# Patient Record
Sex: Female | Born: 1975 | Race: Asian | Hispanic: No | Smoking: Never smoker
Health system: Southern US, Community
[De-identification: ages and names within clinical notes are randomized; demographics above are authoritative.]

## PROBLEM LIST (undated history)

## (undated) DIAGNOSIS — N939 Abnormal uterine and vaginal bleeding, unspecified: Secondary | ICD-10-CM

## (undated) DIAGNOSIS — I10 Essential (primary) hypertension: Secondary | ICD-10-CM

## (undated) DIAGNOSIS — R51 Headache: Secondary | ICD-10-CM

## (undated) DIAGNOSIS — G4730 Sleep apnea, unspecified: Secondary | ICD-10-CM

## (undated) DIAGNOSIS — R519 Headache, unspecified: Secondary | ICD-10-CM

## (undated) HISTORY — PX: NO PAST SURGERIES: SHX2092

---

## 2016-02-11 DIAGNOSIS — I10 Essential (primary) hypertension: Secondary | ICD-10-CM | POA: Diagnosis not present

## 2016-03-29 DIAGNOSIS — D2230 Melanocytic nevi of unspecified part of face: Secondary | ICD-10-CM | POA: Diagnosis not present

## 2016-03-29 DIAGNOSIS — D2239 Melanocytic nevi of other parts of face: Secondary | ICD-10-CM | POA: Diagnosis not present

## 2016-03-29 DIAGNOSIS — L738 Other specified follicular disorders: Secondary | ICD-10-CM | POA: Diagnosis not present

## 2016-06-22 DIAGNOSIS — M7711 Lateral epicondylitis, right elbow: Secondary | ICD-10-CM | POA: Diagnosis not present

## 2016-08-06 DIAGNOSIS — I10 Essential (primary) hypertension: Secondary | ICD-10-CM | POA: Diagnosis not present

## 2016-08-06 DIAGNOSIS — Z0000 Encounter for general adult medical examination without abnormal findings: Secondary | ICD-10-CM | POA: Diagnosis not present

## 2016-08-09 ENCOUNTER — Other Ambulatory Visit: Payer: Self-pay | Admitting: Family Medicine

## 2016-08-09 DIAGNOSIS — Z1231 Encounter for screening mammogram for malignant neoplasm of breast: Secondary | ICD-10-CM

## 2016-08-17 ENCOUNTER — Ambulatory Visit
Admission: RE | Admit: 2016-08-17 | Discharge: 2016-08-17 | Disposition: A | Discharge status: Home / Self Care | Payer: 59 | Source: Ambulatory Visit | Attending: Family Medicine | Admitting: Family Medicine

## 2016-08-17 DIAGNOSIS — Z1231 Encounter for screening mammogram for malignant neoplasm of breast: Secondary | ICD-10-CM | POA: Diagnosis not present

## 2016-08-18 ENCOUNTER — Other Ambulatory Visit: Payer: Self-pay | Admitting: Family Medicine

## 2016-08-18 ENCOUNTER — Other Ambulatory Visit (HOSPITAL_COMMUNITY)
Admission: RE | Admit: 2016-08-18 | Discharge: 2016-08-18 | Disposition: A | Discharge status: Home / Self Care | Payer: 59 | Source: Ambulatory Visit | Attending: Family Medicine | Admitting: Family Medicine

## 2016-08-18 DIAGNOSIS — R102 Pelvic and perineal pain: Secondary | ICD-10-CM | POA: Diagnosis not present

## 2016-08-18 DIAGNOSIS — Z13220 Encounter for screening for lipoid disorders: Secondary | ICD-10-CM | POA: Diagnosis not present

## 2016-08-18 DIAGNOSIS — Z124 Encounter for screening for malignant neoplasm of cervix: Secondary | ICD-10-CM | POA: Insufficient documentation

## 2016-08-18 DIAGNOSIS — N920 Excessive and frequent menstruation with regular cycle: Secondary | ICD-10-CM | POA: Diagnosis not present

## 2016-08-18 DIAGNOSIS — I10 Essential (primary) hypertension: Secondary | ICD-10-CM | POA: Diagnosis not present

## 2016-08-19 ENCOUNTER — Other Ambulatory Visit: Payer: Self-pay | Admitting: Family Medicine

## 2016-08-19 DIAGNOSIS — R102 Pelvic and perineal pain: Secondary | ICD-10-CM

## 2016-08-19 DIAGNOSIS — M545 Low back pain: Secondary | ICD-10-CM | POA: Diagnosis not present

## 2016-08-19 LAB — CYTOLOGY - PAP
Diagnosis: NEGATIVE
HPV: NOT DETECTED

## 2016-08-24 ENCOUNTER — Other Ambulatory Visit: Payer: 59

## 2016-08-25 DIAGNOSIS — M545 Low back pain: Secondary | ICD-10-CM | POA: Diagnosis not present

## 2016-08-26 ENCOUNTER — Ambulatory Visit
Admission: RE | Admit: 2016-08-26 | Discharge: 2016-08-26 | Disposition: A | Discharge status: Home / Self Care | Payer: 59 | Source: Ambulatory Visit | Attending: Family Medicine | Admitting: Family Medicine

## 2016-08-26 DIAGNOSIS — R102 Pelvic and perineal pain: Secondary | ICD-10-CM

## 2016-08-31 ENCOUNTER — Other Ambulatory Visit (HOSPITAL_COMMUNITY): Payer: Self-pay | Admitting: Family Medicine

## 2016-08-31 DIAGNOSIS — N9489 Other specified conditions associated with female genital organs and menstrual cycle: Secondary | ICD-10-CM

## 2016-09-01 DIAGNOSIS — M545 Low back pain: Secondary | ICD-10-CM | POA: Diagnosis not present

## 2016-09-14 DIAGNOSIS — M545 Low back pain: Secondary | ICD-10-CM | POA: Diagnosis not present

## 2016-09-16 DIAGNOSIS — M545 Low back pain: Secondary | ICD-10-CM | POA: Diagnosis not present

## 2016-09-17 ENCOUNTER — Ambulatory Visit (HOSPITAL_COMMUNITY)
Admission: RE | Admit: 2016-09-17 | Discharge: 2016-09-17 | Disposition: A | Discharge status: Home / Self Care | Payer: 59 | Source: Ambulatory Visit | Attending: Family Medicine | Admitting: Family Medicine

## 2016-09-17 DIAGNOSIS — N9489 Other specified conditions associated with female genital organs and menstrual cycle: Secondary | ICD-10-CM | POA: Diagnosis not present

## 2016-09-17 DIAGNOSIS — N840 Polyp of corpus uteri: Secondary | ICD-10-CM | POA: Diagnosis not present

## 2016-09-27 DIAGNOSIS — M545 Low back pain: Secondary | ICD-10-CM | POA: Diagnosis not present

## 2016-09-30 DIAGNOSIS — M545 Low back pain: Secondary | ICD-10-CM | POA: Diagnosis not present

## 2016-10-05 DIAGNOSIS — M545 Low back pain: Secondary | ICD-10-CM | POA: Diagnosis not present

## 2016-10-14 DIAGNOSIS — M545 Low back pain: Secondary | ICD-10-CM | POA: Diagnosis not present

## 2016-10-28 DIAGNOSIS — M545 Low back pain: Secondary | ICD-10-CM | POA: Diagnosis not present

## 2016-11-11 DIAGNOSIS — M545 Low back pain: Secondary | ICD-10-CM | POA: Diagnosis not present

## 2016-11-12 DIAGNOSIS — N840 Polyp of corpus uteri: Secondary | ICD-10-CM | POA: Diagnosis not present

## 2016-11-12 DIAGNOSIS — R030 Elevated blood-pressure reading, without diagnosis of hypertension: Secondary | ICD-10-CM | POA: Diagnosis not present

## 2016-11-12 DIAGNOSIS — N920 Excessive and frequent menstruation with regular cycle: Secondary | ICD-10-CM | POA: Diagnosis not present

## 2016-12-16 DIAGNOSIS — N920 Excessive and frequent menstruation with regular cycle: Secondary | ICD-10-CM | POA: Diagnosis not present

## 2016-12-16 DIAGNOSIS — N840 Polyp of corpus uteri: Secondary | ICD-10-CM | POA: Diagnosis not present

## 2016-12-16 DIAGNOSIS — I10 Essential (primary) hypertension: Secondary | ICD-10-CM | POA: Diagnosis not present

## 2016-12-28 DIAGNOSIS — I10 Essential (primary) hypertension: Secondary | ICD-10-CM | POA: Diagnosis not present

## 2017-02-04 DIAGNOSIS — I10 Essential (primary) hypertension: Secondary | ICD-10-CM | POA: Diagnosis not present

## 2017-03-31 DIAGNOSIS — N840 Polyp of corpus uteri: Secondary | ICD-10-CM | POA: Diagnosis not present

## 2017-03-31 DIAGNOSIS — N920 Excessive and frequent menstruation with regular cycle: Secondary | ICD-10-CM | POA: Diagnosis not present

## 2017-03-31 DIAGNOSIS — Z01818 Encounter for other preprocedural examination: Secondary | ICD-10-CM | POA: Diagnosis not present

## 2017-04-04 NOTE — H&P (Signed)
42yo G1P1 who presents for hysteroscopy, D&C, Novasure ablation due to AUB.  Menses regular each month- 7 days of bleeding with 2 very heavy days. Over the past several months, she has continued to noted heavier periods with increased dysmenorrhea. She has been using POPs, but it has not improved her symptoms.Denies intermenstrual bleeding. Denies abnormal discharge, itching or irritation. No sexual dysfxn. Denies hot flashes or nights sweats. No pelvic or abdominal pain. No acute complaints. SHG performed 12/16/2016: 8.4cm anteverted uterus, normal shape and size. Right ovary wnl- prior cyst resolved. Left ovary with 2.1cm complex cyst- avascular. Following SHG- 1.8cm hypoechoic mass noted, avascular.      ROS:  CONSTITUTIONAL:  no Chills. no Fever. no Night sweats.  HEENT:  Blurrred vision no. no Double vision.  CARDIOLOGY:  no Chest pain.  RESPIRATORY:  no Shortness of breath. no Cough.  UROLOGY:  no Urinary frequency. no Urinary incontinence. no Urinary urgency.  GASTROENTEROLOGY:  no Abdominal pain. no Appetite change. no Change in bowel movements.  FEMALE REPRODUCTIVE:  no Breast lumps or discharge. no Breast pain.  NEUROLOGY:  no Dizziness. no Headache. no Loss of consciousness.  PSYCHOLOGY:  no Anxiety. no Depression.  SKIN:  no Rash. no Hives.  HEMATOLOGY/LYMPH:  no Anemia. no Fatigue. Using Blood Thinners no.         Medical History: HTN, Sleep apnea, Anxiety, Ovarian cysts, uterine fibroid.         Gyn History:  Sexual activity currently sexually active.  Periods : every month.  LMP 03/29/2017.  Birth control ocp.  Last pap smear date 08/18/2016.  Last mammogram date 08/17/2016.  Denies H/O Abnormal pap smear.  Denies H/O STD.        OB History:  Pregnancy # 1 live birth, vaginal delivery.        Surgical History: lipoma from back removed 2002.        Family History: Father: deceased, smoker, diagnosed with Diabetes, Hypertension, CVA, Lung Cancer. Mother:  alive, A + W. Maternal Grand Mother: deceased, stroke. Sister 1: alive, Hypertension.  denies any GYN family cancer hx.       Social History:  General:  Tobacco use  cigarettes: Never smoked Tobacco history last updated 03/31/2017 no EXPOSURE TO PASSIVE SMOKE.  Alcohol: occasionally.  Caffeine: yes.  Exercise: nothing structured.  Marital Status: single.  Children: 1.  OCCUPATION: employed, Dispensing optician.        Medications: Taking Multivitamin . Tablet 1 tablet by mouth Once daily, Taking Norethindrone 0.35 MG Tablet 1 tablet Orally once a day, Notes: DR. Ladell Lea, Taking Amlodipine Besylate 5 MG Tablet 1 tablet Orally Once a day, Taking Lisinopril 20 MG Tablet 2 tablets Orally Once a day, Not-Taking Calcium 600 MG Tablet 1 tablet with meals Orally Twice a day, Not-Taking Fish Oil 1000 MG Capsule 1 capsule Orally Once a day, Not-Taking Glucosamine Chondr Complex(Glucosamine-Chondroitin) 500-400 MG Capsule 1 capsule with a meal Orally Once a day, Medication List reviewed and reconciled with the patient       Allergies: Sulfa drugs (for allergy): vomiting.    O: examination performed in office GENERAL APPEARANCE well developed, well nourished.  SKIN: warm and dry, no rashes.  NECK: supple, normal appearance.  LUNGS: clear to auscultation bilaterally, no wheezes, rhonchi, rales.  HEART: no murmurs, regular rate and rhythm.  ABDOMEN: soft and not tender, no masses palpated, no rebound, no rigidity.  MUSCULOSKELETAL no calf tenderness bilaterally.  EXTREMITIES: no edema present.  NEUROLOGIC EXAM: alert and oriented  x 3.  PSYCH: appropriate mood and affect.   A/P: 42yo G1P1 who presents for hysteroscopy, D&C, Novasure ablation due to heavy menstrual bleeding -NPO -LR @ 125cc/hr -SCDs to OR -Risk/benefit and alternatives reviewed with pt including but not limited to risk of bleeding, infection, uterine perforation and inability or failure of the device.  Pt aware and wishes to  proceed  Janyth Pupa, DO [phone] (cell) [phone] (office)

## 2017-04-05 ENCOUNTER — Encounter (HOSPITAL_BASED_OUTPATIENT_CLINIC_OR_DEPARTMENT_OTHER): Payer: Self-pay | Admitting: *Deleted

## 2017-04-05 ENCOUNTER — Other Ambulatory Visit: Payer: Self-pay

## 2017-04-06 ENCOUNTER — Encounter (HOSPITAL_BASED_OUTPATIENT_CLINIC_OR_DEPARTMENT_OTHER)
Admission: RE | Admit: 2017-04-06 | Discharge: 2017-04-06 | Disposition: A | Discharge status: Home / Self Care | Payer: 59 | Source: Ambulatory Visit | Attending: Obstetrics & Gynecology | Admitting: Obstetrics & Gynecology

## 2017-04-06 DIAGNOSIS — I10 Essential (primary) hypertension: Secondary | ICD-10-CM | POA: Insufficient documentation

## 2017-04-06 DIAGNOSIS — Z01812 Encounter for preprocedural laboratory examination: Secondary | ICD-10-CM | POA: Insufficient documentation

## 2017-04-06 DIAGNOSIS — Z01810 Encounter for preprocedural cardiovascular examination: Secondary | ICD-10-CM | POA: Diagnosis present

## 2017-04-06 LAB — CBC
HEMATOCRIT: 38.2 % (ref 36.0–46.0)
HEMOGLOBIN: 12.8 g/dL (ref 12.0–15.0)
MCH: 31.1 pg (ref 26.0–34.0)
MCHC: 33.5 g/dL (ref 30.0–36.0)
MCV: 92.7 fL (ref 78.0–100.0)
Platelets: 273 10*3/uL (ref 150–400)
RBC: 4.12 MIL/uL (ref 3.87–5.11)
RDW: 12.9 % (ref 11.5–15.5)
WBC: 8.9 10*3/uL (ref 4.0–10.5)

## 2017-04-06 LAB — BASIC METABOLIC PANEL
ANION GAP: 7 (ref 5–15)
BUN: 18 mg/dL (ref 6–20)
CHLORIDE: 106 mmol/L (ref 101–111)
CO2: 24 mmol/L (ref 22–32)
Calcium: 9.4 mg/dL (ref 8.9–10.3)
Creatinine, Ser: 0.58 mg/dL (ref 0.44–1.00)
GFR calc Af Amer: >60 mL/min (ref >60)
GFR calc non Af Amer: >60 mL/min (ref >60)
GLUCOSE: 97 mg/dL (ref 65–99)
POTASSIUM: 4.2 mmol/L (ref 3.5–5.1)
Sodium: 137 mmol/L (ref 135–145)

## 2017-04-06 LAB — POCT PREGNANCY, URINE: Preg Test, Ur: NEGATIVE

## 2017-04-11 ENCOUNTER — Ambulatory Visit (HOSPITAL_BASED_OUTPATIENT_CLINIC_OR_DEPARTMENT_OTHER): Payer: 59 | Admitting: Anesthesiology

## 2017-04-11 ENCOUNTER — Ambulatory Visit (HOSPITAL_COMMUNITY)
Admission: RE | Admit: 2017-04-11 | Discharge: 2017-04-11 | Disposition: A | Discharge status: Home / Self Care | Payer: 59 | Source: Ambulatory Visit | Attending: Obstetrics & Gynecology | Admitting: Obstetrics & Gynecology

## 2017-04-11 ENCOUNTER — Encounter (HOSPITAL_BASED_OUTPATIENT_CLINIC_OR_DEPARTMENT_OTHER)
Admission: RE | Disposition: A | Discharge status: Home / Self Care | Payer: Self-pay | Source: Ambulatory Visit | Attending: Obstetrics & Gynecology

## 2017-04-11 ENCOUNTER — Encounter (HOSPITAL_BASED_OUTPATIENT_CLINIC_OR_DEPARTMENT_OTHER): Payer: Self-pay | Admitting: *Deleted

## 2017-04-11 ENCOUNTER — Other Ambulatory Visit: Payer: Self-pay

## 2017-04-11 DIAGNOSIS — N939 Abnormal uterine and vaginal bleeding, unspecified: Secondary | ICD-10-CM | POA: Diagnosis present

## 2017-04-11 DIAGNOSIS — Z793 Long term (current) use of hormonal contraceptives: Secondary | ICD-10-CM | POA: Diagnosis not present

## 2017-04-11 DIAGNOSIS — G4730 Sleep apnea, unspecified: Secondary | ICD-10-CM | POA: Insufficient documentation

## 2017-04-11 DIAGNOSIS — Z79899 Other long term (current) drug therapy: Secondary | ICD-10-CM | POA: Insufficient documentation

## 2017-04-11 DIAGNOSIS — I10 Essential (primary) hypertension: Secondary | ICD-10-CM | POA: Insufficient documentation

## 2017-04-11 DIAGNOSIS — N920 Excessive and frequent menstruation with regular cycle: Secondary | ICD-10-CM | POA: Diagnosis not present

## 2017-04-11 HISTORY — DX: Abnormal uterine and vaginal bleeding, unspecified: N93.9

## 2017-04-11 HISTORY — DX: Headache: R51

## 2017-04-11 HISTORY — PX: DILITATION & CURRETTAGE/HYSTROSCOPY WITH NOVASURE ABLATION: SHX5568

## 2017-04-11 HISTORY — DX: Essential (primary) hypertension: I10

## 2017-04-11 HISTORY — DX: Sleep apnea, unspecified: G47.30

## 2017-04-11 HISTORY — DX: Headache, unspecified: R51.9

## 2017-04-11 SURGERY — DILATATION & CURETTAGE/HYSTEROSCOPY WITH NOVASURE ABLATION
Anesthesia: General | Site: Uterus

## 2017-04-11 MED ORDER — SODIUM CHLORIDE 0.9 % IR SOLN
Status: DC | PRN
  Administered 2017-04-11: 3000 mL

## 2017-04-11 MED ORDER — SILVER NITRATE-POT NITRATE 75-25 % EX MISC
CUTANEOUS | Status: AC
  Filled 2017-04-11: qty 1

## 2017-04-11 MED ORDER — DEXAMETHASONE SODIUM PHOSPHATE 4 MG/ML IJ SOLN
INTRAMUSCULAR | Status: DC | PRN
  Administered 2017-04-11: 10 mg via INTRAVENOUS

## 2017-04-11 MED ORDER — MIDAZOLAM HCL 2 MG/2ML IJ SOLN
INTRAMUSCULAR | Status: AC
Start: 2017-04-11 — End: 2017-04-11
  Filled 2017-04-11: qty 2

## 2017-04-11 MED ORDER — LIDOCAINE-EPINEPHRINE (PF) 1 %-1:200000 IJ SOLN
INTRAMUSCULAR | Status: AC
  Filled 2017-04-11: qty 30

## 2017-04-11 MED ORDER — ONDANSETRON HCL 4 MG/2ML IJ SOLN
INTRAMUSCULAR | Status: AC
  Filled 2017-04-11: qty 2

## 2017-04-11 MED ORDER — LIDOCAINE-EPINEPHRINE (PF) 1 %-1:200000 IJ SOLN
INTRAMUSCULAR | Status: DC | PRN
  Administered 2017-04-11: 20 mL

## 2017-04-11 MED ORDER — LACTATED RINGERS IV SOLN: INTRAVENOUS | Status: DC

## 2017-04-11 MED ORDER — METOCLOPRAMIDE HCL 5 MG/ML IJ SOLN
INTRAMUSCULAR | Status: AC
  Filled 2017-04-11: qty 2

## 2017-04-11 MED ORDER — PROPOFOL 10 MG/ML IV BOLUS
INTRAVENOUS | Status: AC
  Filled 2017-04-11: qty 20

## 2017-04-11 MED ORDER — FENTANYL CITRATE (PF) 100 MCG/2ML IJ SOLN
INTRAMUSCULAR | Status: AC
Start: 2017-04-11 — End: 2017-04-11
  Filled 2017-04-11: qty 2

## 2017-04-11 MED ORDER — FENTANYL CITRATE (PF) 100 MCG/2ML IJ SOLN
25.0000 ug | INTRAMUSCULAR | Status: DC | PRN
  Administered 2017-04-11: 50 ug via INTRAVENOUS

## 2017-04-11 MED ORDER — ONDANSETRON HCL 4 MG/2ML IJ SOLN
INTRAMUSCULAR | Status: DC | PRN
  Administered 2017-04-11: 4 mg via INTRAVENOUS

## 2017-04-11 MED ORDER — MIDAZOLAM HCL 2 MG/2ML IJ SOLN: 1.0000 mg | INTRAMUSCULAR | Status: DC | PRN

## 2017-04-11 MED ORDER — OXYCODONE HCL 5 MG PO TABS
5.0000 mg | ORAL_TABLET | Freq: Once | ORAL | Status: AC
  Administered 2017-04-11: 5 mg via ORAL

## 2017-04-11 MED ORDER — KETOROLAC TROMETHAMINE 30 MG/ML IJ SOLN
INTRAMUSCULAR | Status: DC | PRN
  Administered 2017-04-11: 30 mg via INTRAVENOUS

## 2017-04-11 MED ORDER — OXYCODONE HCL 5 MG PO TABS
ORAL_TABLET | ORAL | Status: AC
  Filled 2017-04-11: qty 1

## 2017-04-11 MED ORDER — SILVER NITRATE-POT NITRATE 75-25 % EX MISC
CUTANEOUS | Status: DC | PRN
  Administered 2017-04-11: 1

## 2017-04-11 MED ORDER — PROPOFOL 10 MG/ML IV BOLUS
INTRAVENOUS | Status: DC | PRN
  Administered 2017-04-11: 40 mg via INTRAVENOUS
  Administered 2017-04-11: 160 mg via INTRAVENOUS

## 2017-04-11 MED ORDER — LIDOCAINE HCL (CARDIAC) 20 MG/ML IV SOLN
INTRAVENOUS | Status: AC
  Filled 2017-04-11: qty 5

## 2017-04-11 MED ORDER — DEXAMETHASONE SODIUM PHOSPHATE 10 MG/ML IJ SOLN
INTRAMUSCULAR | Status: AC
  Filled 2017-04-11: qty 1

## 2017-04-11 MED ORDER — OXYCODONE-ACETAMINOPHEN 5-325 MG PO TABS: 1.0000 | ORAL_TABLET | Freq: Four times a day (QID) | ORAL | 0 refills | Status: AC | PRN

## 2017-04-11 MED ORDER — KETOROLAC TROMETHAMINE 30 MG/ML IJ SOLN
INTRAMUSCULAR | Status: AC
  Filled 2017-04-11: qty 1

## 2017-04-11 MED ORDER — MEPERIDINE HCL 25 MG/ML IJ SOLN: 6.2500 mg | INTRAMUSCULAR | Status: DC | PRN

## 2017-04-11 MED ORDER — MIDAZOLAM HCL 5 MG/5ML IJ SOLN
INTRAMUSCULAR | Status: DC | PRN
  Administered 2017-04-11: 2 mg via INTRAVENOUS

## 2017-04-11 MED ORDER — FENTANYL CITRATE (PF) 100 MCG/2ML IJ SOLN
INTRAMUSCULAR | Status: DC | PRN
  Administered 2017-04-11 (×4): 25 ug via INTRAVENOUS

## 2017-04-11 MED ORDER — LACTATED RINGERS IV SOLN
INTRAVENOUS | Status: DC
  Administered 2017-04-11 (×2): via INTRAVENOUS

## 2017-04-11 MED ORDER — SCOPOLAMINE 1 MG/3DAYS TD PT72: 1.0000 | MEDICATED_PATCH | Freq: Once | TRANSDERMAL | Status: DC | PRN

## 2017-04-11 MED ORDER — FENTANYL CITRATE (PF) 100 MCG/2ML IJ SOLN: 50.0000 ug | INTRAMUSCULAR | Status: DC | PRN

## 2017-04-11 MED ORDER — METOCLOPRAMIDE HCL 5 MG/ML IJ SOLN
10.0000 mg | Freq: Once | INTRAMUSCULAR | Status: AC | PRN
  Administered 2017-04-11: 5 mg via INTRAVENOUS

## 2017-04-11 MED ORDER — LIDOCAINE HCL (CARDIAC) 20 MG/ML IV SOLN
INTRAVENOUS | Status: DC | PRN
  Administered 2017-04-11: 60 mg via INTRAVENOUS

## 2017-04-11 SURGICAL SUPPLY — 19 items
ABLATOR ENDOMETRIAL BIPOLAR (ABLATOR) ×2 IMPLANT
BRIEF STRETCH FOR OB PAD XXL (UNDERPADS AND DIAPERS) ×2 IMPLANT
CANISTER SUCT 3000ML PPV (MISCELLANEOUS) ×2 IMPLANT
CATH ROBINSON RED A/P 16FR (CATHETERS) ×2 IMPLANT
CLOTH BEACON ORANGE TIMEOUT ST (SAFETY) ×2 IMPLANT
CONTAINER PREFILL 10% NBF 60ML (FORM) ×4 IMPLANT
DILATOR CANAL MILEX (MISCELLANEOUS) IMPLANT
GLOVE BIOGEL PI IND STRL 6.5 (GLOVE) ×1 IMPLANT
GLOVE BIOGEL PI IND STRL 7.0 (GLOVE) ×1 IMPLANT
GLOVE BIOGEL PI INDICATOR 6.5 (GLOVE) ×1
GLOVE BIOGEL PI INDICATOR 7.0 (GLOVE) ×1
GLOVE ECLIPSE 6.5 STRL STRAW (GLOVE) ×2 IMPLANT
GOWN STRL REUS W/TWL LRG LVL3 (GOWN DISPOSABLE) ×4 IMPLANT
PACK LAPAROSCOPY BASIN (CUSTOM PROCEDURE TRAY) ×2 IMPLANT
PACK VAGINAL MINOR WOMEN LF (CUSTOM PROCEDURE TRAY) IMPLANT
PAD OB MATERNITY 4.3X12.25 (PERSONAL CARE ITEMS) ×2 IMPLANT
TOWEL OR 17X24 6PK STRL BLUE (TOWEL DISPOSABLE) ×4 IMPLANT
TUBING AQUILEX INFLOW (TUBING) ×2 IMPLANT
TUBING AQUILEX OUTFLOW (TUBING) ×2 IMPLANT

## 2017-04-11 NOTE — Interval H&P Note (Signed)
History and Physical Interval Note:  04/11/2017 11:45 AM  Jessica Ruiz  has presented today for surgery, with the diagnosis of N84.0 Endometrial Polyp N92.0 Menorrhagia  The various methods of treatment have been discussed with the patient and family. After consideration of risks, benefits and other options for treatment, the patient has consented to  Procedure(s) with comments: [place] (N/A) - Polypectomy as a surgical intervention .  The patient's history has been reviewed, patient examined, no change in status, stable for surgery.  I have reviewed the patient's chart and labs.  Questions were answered to the patient's satisfaction.     Annalee Genta

## 2017-04-11 NOTE — Transfer of Care (Signed)
Immediate Anesthesia Transfer of Care Note  Patient: Jessica Ruiz  Procedure(s) Performed: Procedure(s) (LRB): DILATATION & CURETTAGE/HYSTEROSCOPY WITH NOVASURE ABLATION (N/A)  Patient Location: PACU  Anesthesia Type: General  Level of Consciousness: awake, sedated, patient cooperative and responds to stimulation  Airway & Oxygen Therapy: Patient Spontanous Breathing and Patient connected to RA  Post-op Assessment: Report given to PACU RN, Post -op Vital signs reviewed and stable and Patient moving all extremities  Post vital signs: Reviewed and stable  Complications: No apparent anesthesia complications

## 2017-04-11 NOTE — Op Note (Signed)
Operative Report  PreOp: AUB PostOp: same Procedure:  Hysteroscopy, Dilation and Curettage, Endometrial ablation Surgeon: Dr. Janyth Pupa Anesthesia: General Complications:none EBL: 5cc Discrepancy: <100cc  Findings: 8cm anteverted uterus with proliferative endometrium, both ostia visualized Specimens: endometrial curettings   Procedure: The patient was taken to the operating room where she underwent general anesthesia without difficulty. The patient was placed in a low lithotomy position using Allen stirrups. The patient was examined with the findings as noted above.  She was then prepped and draped in the normal sterile fashion. A sterile speculum was inserted into the vagina. A single tooth tenaculum was placed on the anterior lip of the cervix. Cervical block was used using 1% lidocaine with epi.  The uterus was then sounded to 8cm. The diagnostic hysteroscope was then inserted without difficulty and noted to have the findings as listed above. Using the hysteroscope, the cervical length was noted to be 4cm. Visualization was achieved using NS as a distending medium. The hysteroscope was removed and sharp curettage was performed. The tissue was sent to pathology.   Attention was then turned to the Novasure. The Novasure was set up according to manufacture instructions. The cavity length was set to 4. The Novasure was inserted, seating test performed and the cavity width was noted to be 4.1. Cavity assessment was performed and passed. The device was then activated for 84sec at a power level of 90. Upon completion, the Novasure was removed and the hysteroscope was reinserted. Global ablation was visualized and no uterine perforation was seen. All instrument were then removed. Hemostasis was achieved at the cervical site using silver nitrazine.  The patient was repositioned to the supine position. The patient tolerated the procedure without any complications and taken to recovery in stable  condition.   Janyth Pupa, DO [phone] (pager) [phone] (office)

## 2017-04-11 NOTE — Anesthesia Procedure Notes (Signed)
Procedure Name: LMA Insertion Date/Time: 04/11/2017 12:07 PM Performed by: Justice Rocher, CRNA Pre-anesthesia Checklist: Patient identified, Emergency Drugs available, Suction available and Patient being monitored Patient Re-evaluated:Patient Re-evaluated prior to induction Oxygen Delivery Method: Circle system utilized Preoxygenation: Pre-oxygenation with 100% oxygen Induction Type: IV induction Ventilation: Mask ventilation without difficulty LMA: LMA inserted LMA Size: 4.0 Number of attempts: 1 Airway Equipment and Method: Bite block Placement Confirmation: positive ETCO2 Tube secured with: Tape Dental Injury: Teeth and Oropharynx as per pre-operative assessment

## 2017-04-11 NOTE — Anesthesia Preprocedure Evaluation (Signed)
Anesthesia Evaluation  Patient identified by MRN, date of birth, ID band Patient awake    Reviewed: Allergy & Precautions, NPO status , Patient's Chart, lab work & pertinent test results  Airway Mallampati: II  TM Distance: >3 FB Neck ROM: Full   Comment: braces Dental no notable dental hx.    Pulmonary sleep apnea ,    Pulmonary exam normal breath sounds clear to auscultation       Cardiovascular hypertension, Pt. on medications Normal cardiovascular exam Rhythm:Regular Rate:Normal     Neuro/Psych negative neurological ROS  negative psych ROS   GI/Hepatic negative GI ROS, Neg liver ROS,   Endo/Other  negative endocrine ROS  Renal/GU negative Renal ROS  negative genitourinary   Musculoskeletal negative musculoskeletal ROS (+)   Abdominal   Peds negative pediatric ROS (+)  Hematology negative hematology ROS (+)   Anesthesia Other Findings   Reproductive/Obstetrics negative OB ROS                             Anesthesia Physical Anesthesia Plan  ASA: II  Anesthesia Plan: General   Post-op Pain Management:    Induction: Intravenous  PONV Risk Score and Plan: 4 or greater and Ondansetron, Dexamethasone, Midazolam and Scopolamine patch - Pre-op  Airway Management Planned: LMA  Additional Equipment:   Intra-op Plan:   Post-operative Plan:   Informed Consent: I have reviewed the patients History and Physical, chart, labs and discussed the procedure including the risks, benefits and alternatives for the proposed anesthesia with the patient or authorized representative who has indicated his/her understanding and acceptance.   Dental advisory given  Plan Discussed with: CRNA  Anesthesia Plan Comments:         Anesthesia Quick Evaluation

## 2017-04-11 NOTE — Discharge Instructions (Addendum)
Post Anesthesia Home Care Instructions  Activity: Get plenty of rest for the remainder of the day. A responsible individual must stay with you for 24 hours following the procedure.  For the next 24 hours, DO NOT: -Drive a car -Paediatric nurse -Drink alcoholic beverages -Take any medication unless instructed by your physician -Make any legal decisions or sign important papers.  Meals: Start with liquid foods such as gelatin or soup. Progress to regular foods as tolerated. Avoid greasy, spicy, heavy foods. If nausea and/or vomiting occur, drink only clear liquids until the nausea and/or vomiting subsides. Call your physician if vomiting continues.  Special Instructions/Symptoms: Your throat may feel dry or sore from the anesthesia or the breathing tube placed in your throat during surgery. If this causes discomfort, gargle with warm salt water. The discomfort should disappear within 24 hours.  If you had a scopolamine patch placed behind your ear for the management of post- operative nausea and/or vomiting:  1. The medication in the patch is effective for 72 hours, after which it should be removed.  Wrap patch in a tissue and discard in the trash. Wash hands thoroughly with soap and water. 2. You may remove the patch earlier than 72 hours if you experience unpleasant side effects which may include dry mouth, dizziness or visual disturbances. 3. Avoid touching the patch. Wash your hands with soap and water after contact with the patch.   HOME INSTRUCTIONS  Please note any unusual or excessive bleeding, pain, swelling. Mild dizziness or drowsiness are normal for about 24 hours after surgery.   Shower when comfortable  Restrictions: No driving for 24 hours or while taking pain medications.  Activity:  No heavy lifting (> 10 lbs), nothing in vagina (no tampons, douching, or intercourse) x 2 weeks; no tub baths for 2 weeks Vaginal spotting is expected but if your bleeding is heavy,  period like,  please call the office   Incision: the bandaids will fall off when they are ready to; you may clean your incision with mild soap and water but do not rub or scrub the incision site.  You may experience slight bloody drainage from your incision periodically.  This is normal.  If you experience a large amount of drainage or the incision opens, please call your physician who will likely direct you to the emergency department.  Diet:  You may return to your regular diet.  Do not eat large meals.  Eat small frequent meals throughout the day.  Continue to drink a good amount of water at least 6-8 glasses of water per day, hydration is very important for the healing process.  Pain Management: Take Motrin and/or Percocet as prescribed/needed for pain.  Always take prescription pain medication with food, it may cause constipation, increase fluids and fiber and you may want to take an over-the-counter stool softener like Colace as needed up to 2x a day.    Alcohol -- Avoid for 24 hours and while taking pain medications.  Nausea: Take sips of ginger ale or soda  Fever -- Call physician if temperature over 101 degrees  Follow up:  If you do not already have a follow up appointment scheduled, please call the office at [phone].  If you experience fever (a temperature greater than 100.4), pain unrelieved by pain medication, shortness of breath, swelling of a single leg, or any other symptoms which are concerning to you please the office immediately.    Post Anesthesia Home Care Instructions  Activity: Get plenty of  rest for the remainder of the day. A responsible individual must stay with you for 24 hours following the procedure.  For the next 24 hours, DO NOT: -Drive a car -Paediatric nurse -Drink alcoholic beverages -Take any medication unless instructed by your physician -Make any legal decisions or sign important papers.  Meals: Start with liquid foods such as gelatin or  soup. Progress to regular foods as tolerated. Avoid greasy, spicy, heavy foods. If nausea and/or vomiting occur, drink only clear liquids until the nausea and/or vomiting subsides. Call your physician if vomiting continues.  Special Instructions/Symptoms: Your throat may feel dry or sore from the anesthesia or the breathing tube placed in your throat during surgery. If this causes discomfort, gargle with warm salt water. The discomfort should disappear within 24 hours.  If you had a scopolamine patch placed behind your ear for the management of post- operative nausea and/or vomiting:  1. The medication in the patch is effective for 72 hours, after which it should be removed.  Wrap patch in a tissue and discard in the trash. Wash hands thoroughly with soap and water. 2. You may remove the patch earlier than 72 hours if you experience unpleasant side effects which may include dry mouth, dizziness or visual disturbances. 3. Avoid touching the patch. Wash your hands with soap and water after contact with the patch.

## 2017-04-11 NOTE — Anesthesia Postprocedure Evaluation (Signed)
Anesthesia Post Note  Patient: Jessica Ruiz  Procedure(s) Performed: DILATATION & CURETTAGE/HYSTEROSCOPY WITH NOVASURE ABLATION (N/A Uterus)     Patient location during evaluation: PACU Anesthesia Type: General Level of consciousness: awake and alert Pain management: pain level controlled Vital Signs Assessment: post-procedure vital signs reviewed and stable Respiratory status: spontaneous breathing, nonlabored ventilation, respiratory function stable and patient connected to nasal cannula oxygen Cardiovascular status: blood pressure returned to baseline and stable Postop Assessment: no apparent nausea or vomiting Anesthetic complications: no    Last Vitals:  Vitals:   04/11/17 1300 04/11/17 1315  BP: (!) 137/91 (!) 131/96  Pulse: 66 75  Resp: 16 14  Temp:    SpO2: 100% 100%    Last Pain:  Vitals:   04/11/17 1315  TempSrc:   PainSc: 3                  Montez Hageman

## 2017-04-12 ENCOUNTER — Encounter (HOSPITAL_BASED_OUTPATIENT_CLINIC_OR_DEPARTMENT_OTHER): Payer: Self-pay | Admitting: Obstetrics & Gynecology

## 2017-04-12 NOTE — Addendum Note (Signed)
Addendum  created 04/12/17 1020 by Yvetta Drotar, Ernesta Amble, CRNA   Charge Capture section accepted

## 2017-04-25 DIAGNOSIS — Z9889 Other specified postprocedural states: Secondary | ICD-10-CM | POA: Diagnosis not present

## 2017-04-25 DIAGNOSIS — Z4889 Encounter for other specified surgical aftercare: Secondary | ICD-10-CM | POA: Diagnosis not present

## 2017-07-16 DIAGNOSIS — S90852A Superficial foreign body, left foot, initial encounter: Secondary | ICD-10-CM | POA: Diagnosis not present

## 2017-08-04 DIAGNOSIS — M79675 Pain in left toe(s): Secondary | ICD-10-CM | POA: Diagnosis not present

## 2017-08-11 DIAGNOSIS — Z0000 Encounter for general adult medical examination without abnormal findings: Secondary | ICD-10-CM | POA: Diagnosis not present

## 2017-08-11 DIAGNOSIS — I10 Essential (primary) hypertension: Secondary | ICD-10-CM | POA: Diagnosis not present

## 2017-12-02 DIAGNOSIS — Z01419 Encounter for gynecological examination (general) (routine) without abnormal findings: Secondary | ICD-10-CM | POA: Diagnosis not present

## 2018-11-19 ENCOUNTER — Encounter (HOSPITAL_COMMUNITY): Payer: Self-pay | Admitting: *Deleted

## 2018-11-19 ENCOUNTER — Other Ambulatory Visit: Payer: Self-pay

## 2018-11-19 ENCOUNTER — Emergency Department (HOSPITAL_COMMUNITY)
Admission: EM | Admit: 2018-11-19 | Discharge: 2018-11-19 | Disposition: A | Discharge status: Home / Self Care | Payer: Managed Care, Other (non HMO) | Attending: Emergency Medicine | Admitting: Emergency Medicine

## 2018-11-19 ENCOUNTER — Emergency Department (HOSPITAL_COMMUNITY): Payer: Managed Care, Other (non HMO)

## 2018-11-19 DIAGNOSIS — Z79899 Other long term (current) drug therapy: Secondary | ICD-10-CM | POA: Insufficient documentation

## 2018-11-19 DIAGNOSIS — G44209 Tension-type headache, unspecified, not intractable: Secondary | ICD-10-CM | POA: Insufficient documentation

## 2018-11-19 DIAGNOSIS — H53149 Visual discomfort, unspecified: Secondary | ICD-10-CM | POA: Diagnosis not present

## 2018-11-19 DIAGNOSIS — I10 Essential (primary) hypertension: Secondary | ICD-10-CM | POA: Insufficient documentation

## 2018-11-19 DIAGNOSIS — F40298 Other specified phobia: Secondary | ICD-10-CM | POA: Diagnosis not present

## 2018-11-19 DIAGNOSIS — R519 Headache, unspecified: Secondary | ICD-10-CM | POA: Diagnosis present

## 2018-11-19 LAB — CBC WITH DIFFERENTIAL/PLATELET
Abs Immature Granulocytes: 0.02 10*3/uL (ref 0.00–0.07)
Basophils Absolute: 0 10*3/uL (ref 0.0–0.1)
Basophils Relative: 0 %
Eosinophils Absolute: 0.1 10*3/uL (ref 0.0–0.5)
Eosinophils Relative: 1 %
HCT: 43.4 % (ref 36.0–46.0)
Hemoglobin: 14.7 g/dL (ref 12.0–15.0)
Immature Granulocytes: 0 %
Lymphocytes Relative: 19 %
Lymphs Abs: 1.6 10*3/uL (ref 0.7–4.0)
MCH: 32.5 pg (ref 26.0–34.0)
MCHC: 33.9 g/dL (ref 30.0–36.0)
MCV: 95.8 fL (ref 80.0–100.0)
Monocytes Absolute: 0.5 10*3/uL (ref 0.1–1.0)
Monocytes Relative: 6 %
Neutro Abs: 6.4 10*3/uL (ref 1.7–7.7)
Neutrophils Relative %: 74 %
Platelets: 241 10*3/uL (ref 150–400)
RBC: 4.53 MIL/uL (ref 3.87–5.11)
RDW: 12.4 % (ref 11.5–15.5)
WBC: 8.6 10*3/uL (ref 4.0–10.5)
nRBC: 0 % (ref 0.0–0.2)

## 2018-11-19 LAB — URINALYSIS, ROUTINE W REFLEX MICROSCOPIC
Bilirubin Urine: NEGATIVE
Glucose, UA: NEGATIVE mg/dL
Hgb urine dipstick: NEGATIVE
Ketones, ur: 20 mg/dL — AB
Leukocytes,Ua: NEGATIVE
Nitrite: NEGATIVE
Protein, ur: NEGATIVE mg/dL
Specific Gravity, Urine: 1.008 (ref 1.005–1.030)
pH: 6 (ref 5.0–8.0)

## 2018-11-19 LAB — COMPREHENSIVE METABOLIC PANEL
ALT: 22 U/L (ref 0–44)
AST: 20 U/L (ref 15–41)
Albumin: 4 g/dL (ref 3.5–5.0)
Alkaline Phosphatase: 54 U/L (ref 38–126)
Anion gap: 8 (ref 5–15)
BUN: 15 mg/dL (ref 6–20)
CO2: 22 mmol/L (ref 22–32)
Calcium: 8.9 mg/dL (ref 8.9–10.3)
Chloride: 106 mmol/L (ref 98–111)
Creatinine, Ser: 0.6 mg/dL (ref 0.44–1.00)
GFR calc Af Amer: >60 mL/min (ref >60)
GFR calc non Af Amer: >60 mL/min (ref >60)
Glucose, Bld: 103 mg/dL — ABNORMAL HIGH (ref 70–99)
Potassium: 3.3 mmol/L — ABNORMAL LOW (ref 3.5–5.1)
Sodium: 136 mmol/L (ref 135–145)
Total Bilirubin: 1.6 mg/dL — ABNORMAL HIGH (ref 0.3–1.2)
Total Protein: 7.3 g/dL (ref 6.5–8.1)

## 2018-11-19 LAB — LIPASE, BLOOD: Lipase: 33 U/L (ref 11–51)

## 2018-11-19 LAB — I-STAT BETA HCG BLOOD, ED (MC, WL, AP ONLY): I-stat hCG, quantitative: <5 m[IU]/mL (ref <5)

## 2018-11-19 LAB — CK: Total CK: 62 U/L (ref 38–234)

## 2018-11-19 MED ORDER — METOCLOPRAMIDE HCL 5 MG/ML IJ SOLN
10.0000 mg | Freq: Once | INTRAMUSCULAR | Status: AC
  Administered 2018-11-19: 10 mg via INTRAVENOUS
  Filled 2018-11-19: qty 2

## 2018-11-19 MED ORDER — SUMATRIPTAN SUCCINATE 100 MG PO TABS: 100.0000 mg | ORAL_TABLET | Freq: Four times a day (QID) | ORAL | 0 refills | Status: AC | PRN

## 2018-11-19 MED ORDER — KETOROLAC TROMETHAMINE 30 MG/ML IJ SOLN
30.0000 mg | Freq: Once | INTRAMUSCULAR | Status: AC
  Administered 2018-11-19: 30 mg via INTRAVENOUS
  Filled 2018-11-19: qty 1

## 2018-11-19 MED ORDER — SODIUM CHLORIDE 0.9 % IV BOLUS
1000.0000 mL | Freq: Once | INTRAVENOUS | Status: AC
  Administered 2018-11-19: 1000 mL via INTRAVENOUS

## 2018-11-19 MED ORDER — DIPHENHYDRAMINE HCL 50 MG/ML IJ SOLN
25.0000 mg | Freq: Once | INTRAMUSCULAR | Status: AC
  Administered 2018-11-19: 25 mg via INTRAVENOUS
  Filled 2018-11-19: qty 1

## 2018-11-19 MED ORDER — PROMETHAZINE HCL 25 MG/ML IJ SOLN
12.5000 mg | Freq: Once | INTRAMUSCULAR | Status: AC
  Administered 2018-11-19: 12.5 mg via INTRAVENOUS
  Filled 2018-11-19: qty 1

## 2018-11-19 NOTE — ED Provider Notes (Signed)
[facility] DEPT Provider Note   CSN: [id number] Arrival date & time: 11/19/18  1222     History   Chief Complaint Chief Complaint  Patient presents with   Nausea    HPI Jessica Ruiz is a 43 y.o. female hx of HTN, here presenting with headaches.  Patient states that she has been having posterior headaches for the last 2 days .  States that it is associated with some photophobia and phonophobia .  She states that the pain radiates down her neck as well .  She also has some low-grade temperature 99 at home .  She initially told urgent care that she has some neck stiffness but denies it to me.  She denies any rash or tick bites. She denies any recent travel or sick contacts .  Denies any chest pain or shortness of breath or abdominal pain or urinary symptoms.     The history is provided by the patient.    Past Medical History:  Diagnosis Date   Abnormal uterine bleeding (AUB)    Headache    Hypertension    Sleep apnea    does not tolerate CPAP, uses oral appliance    There are no active problems to display for this patient.   Past Surgical History:  Procedure Laterality Date   DILITATION & CURRETTAGE/HYSTROSCOPY WITH NOVASURE ABLATION N/A 04/11/2017   Procedure: DILATATION & CURETTAGE/HYSTEROSCOPY WITH NOVASURE ABLATION;  Surgeon: Janyth Pupa, DO;  Location: [facility];  Service: Gynecology;  Laterality: N/A;  Polypectomy   NO PAST SURGERIES       OB History   No obstetric history on file.      Home Medications    Prior to Admission medications   Medication Sig Start Date End Date Taking? Authorizing Provider  ibuprofen (ADVIL) 200 MG tablet Take 400 mg by mouth every 6 (six) hours as needed for fever, headache or moderate pain.   Yes [provider]  lisinopril (ZESTRIL) 40 MG tablet Take 40 mg by mouth daily. 10/10/18  Yes [provider]  Multiple Vitamin (MULTIVITAMIN WITH MINERALS) TABS  tablet Take 1 tablet by mouth daily.   Yes [provider]  norethindrone (MICRONOR,CAMILA,ERRIN) 0.35 MG tablet Take 1 tablet by mouth daily.   Yes [provider]  ondansetron (ZOFRAN) 8 MG tablet Take 8 mg by mouth every 8 (eight) hours as needed for nausea or vomiting.    [provider]  SUMAtriptan (IMITREX) 100 MG tablet Take 100 mg by mouth once. May repeat in 2 hours if headache persists or recurs.    [provider]    Family History No family history on file.  Social History Social History   Tobacco Use   Smoking status: Never Smoker   Smokeless tobacco: Never Used  Substance Use Topics   Alcohol use: Yes    Comment: social   Drug use: Never     Allergies   Sulfa antibiotics   Review of Systems Review of Systems  Neurological: Positive for headaches.  All other systems reviewed and are negative.    Physical Exam Updated Vital Signs BP (!) 153/106 (BP Location: Right Arm)    Pulse 88    Temp 98.5 F (36.9 C) (Oral)    Resp 16    Ht 5\' 3"  (1.6 m)    Wt 63.5 kg    SpO2 97%    BMI 24.80 kg/m   Physical Exam Vitals signs and nursing note reviewed.  Constitutional:      Appearance: Normal appearance.  HENT:     Head: Normocephalic.     Right Ear: Tympanic membrane normal.     Left Ear: Tympanic membrane normal.     Nose: Nose normal.     Mouth/Throat:     Mouth: Mucous membranes are moist.  Eyes:     Extraocular Movements: Extraocular movements intact.     Pupils: Pupils are equal, round, and reactive to light.  Neck:     Comments: No meningeal signs, nl ROM of the neck. Some paracervical tenderness but no midline tenderness  Cardiovascular:     Rate and Rhythm: Normal rate and regular rhythm.     Pulses: Normal pulses.     Heart sounds: Normal heart sounds.  Pulmonary:     Effort: Pulmonary effort is normal.     Breath sounds: Normal breath sounds.  Abdominal:     General: Abdomen is flat.  Musculoskeletal:  Normal range of motion.  Skin:    General: Skin is warm.     Capillary Refill: Capillary refill takes less than 2 seconds.  Neurological:     General: No focal deficit present.     Mental Status: She is alert and oriented to person, place, and time.     Comments: CN 2- 12 intact, nl strength and sensation throughout, nl finger to nose, nl gait   Psychiatric:        Mood and Affect: Mood normal.      ED Treatments / Results  Labs (all labs ordered are listed, but only abnormal results are displayed) Labs Reviewed  CBC WITH DIFFERENTIAL/PLATELET  COMPREHENSIVE METABOLIC PANEL  LIPASE, BLOOD  URINALYSIS, ROUTINE W REFLEX MICROSCOPIC  CK  I-STAT BETA HCG BLOOD, ED ([facility], [facility], [facility] ONLY)    EKG None  Radiology No results found.  Procedures Procedures (including critical care time)  Medications Ordered in ED Medications  sodium chloride 0.9 % bolus 1,000 mL (has no administration in time range)  metoCLOPramide (REGLAN) injection 10 mg (has no administration in time range)  diphenhydrAMINE (BENADRYL) injection 25 mg (has no administration in time range)     Initial Impression / Assessment and Plan / ED Course  I have reviewed the triage vital signs and the nursing notes.  Pertinent labs & imaging results that were available during my care of the patient were reviewed by me and considered in my medical decision making (see chart for details).       Jessica Ruiz is a 43 y.o. female here with headaches, neck pain. Likely tension headaches as she has paracervical muscle tenderness. Nonfocal neuro exam. No meningeal signs to suggest meningitis. Will get labs, CT head. Will give migraine cocktail   4:09 PM Labs unremarkable. CT head unremarkable. Felt better after meds. Stable for discharge    Final Clinical Impressions(s) / ED Diagnoses   Final diagnoses:  None    ED Discharge Orders    None       Drenda Freeze, MD 11/19/18 1609

## 2018-11-19 NOTE — ED Triage Notes (Signed)
Pt is poor historian, states yesterday she had a headache and vomiting, stayed in bed more, upon awakening she said her neck is stiff and she had :a fever of 99.0" She is able to move head with just muscle tenderness on rt side in triage. No fever, headache much better, nausea has improved. States UC sent her here after virtual visit

## 2018-11-19 NOTE — ED Notes (Signed)
Patient aware we need urine sample. Patient will call out once ready to void.  

## 2018-11-19 NOTE — Discharge Instructions (Addendum)
Take imitrex as needed for headaches   See your doctor   Consider following up with neurologist   Return to ER if you have worse headaches, vomiting, fever, neck stiffness

## 2018-12-26 ENCOUNTER — Other Ambulatory Visit: Payer: Self-pay | Admitting: Obstetrics & Gynecology

## 2018-12-26 DIAGNOSIS — Z1231 Encounter for screening mammogram for malignant neoplasm of breast: Secondary | ICD-10-CM

## 2019-02-16 ENCOUNTER — Ambulatory Visit
Admission: RE | Admit: 2019-02-16 | Discharge: 2019-02-16 | Disposition: A | Discharge status: Home / Self Care | Payer: Managed Care, Other (non HMO) | Source: Ambulatory Visit | Attending: Obstetrics & Gynecology | Admitting: Obstetrics & Gynecology

## 2019-02-16 ENCOUNTER — Other Ambulatory Visit: Payer: Self-pay

## 2019-02-16 DIAGNOSIS — Z1231 Encounter for screening mammogram for malignant neoplasm of breast: Secondary | ICD-10-CM

## 2019-05-15 IMAGING — US US TRANSVAGINAL NON-OB
1 series · 13 of 25 positions shown · non-contrast
Comparison: None in PACs

CLINICAL DATA: Painful heavy periods for the past 2 years with
worsening severity.

EXAM:
TRANSABDOMINAL AND TRANSVAGINAL ULTRASOUND OF PELVIS
TECHNIQUE: Both transabdominal and transvaginal ultrasound examinations of the
pelvis were performed. Transabdominal technique was performed for
global imaging of the pelvis including uterus, ovaries, adnexal
regions, and pelvic cul-de-sac. It was necessary to proceed with
endovaginal exam following the transabdominal exam to visualize the
endometrium and right ovary..

[Series 1: us transvaginal non-ob · 0.30mm/px · 13 of 66 slices shown]
[im 1/66]
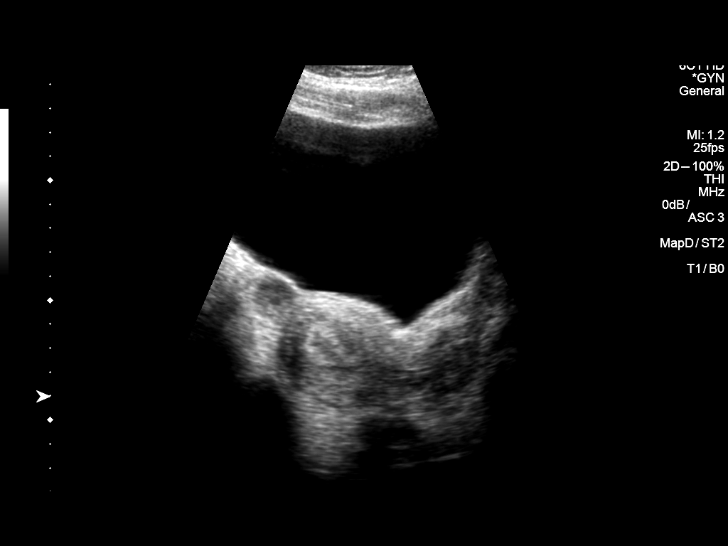
[im 6/66]
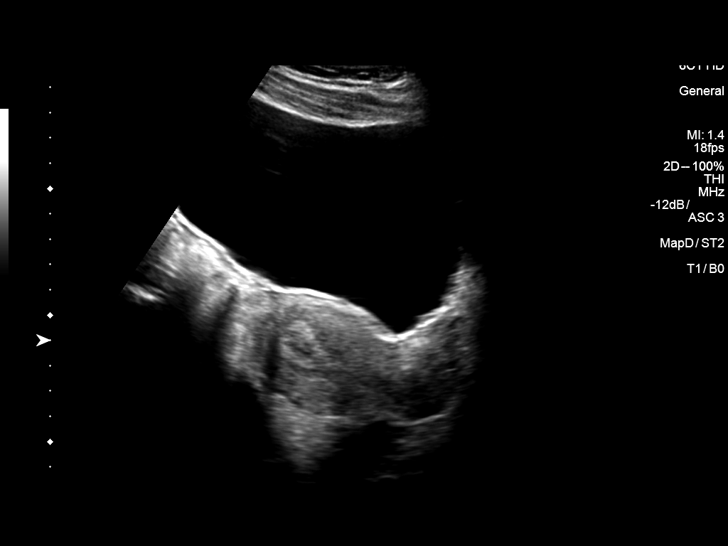
[im 11/66]
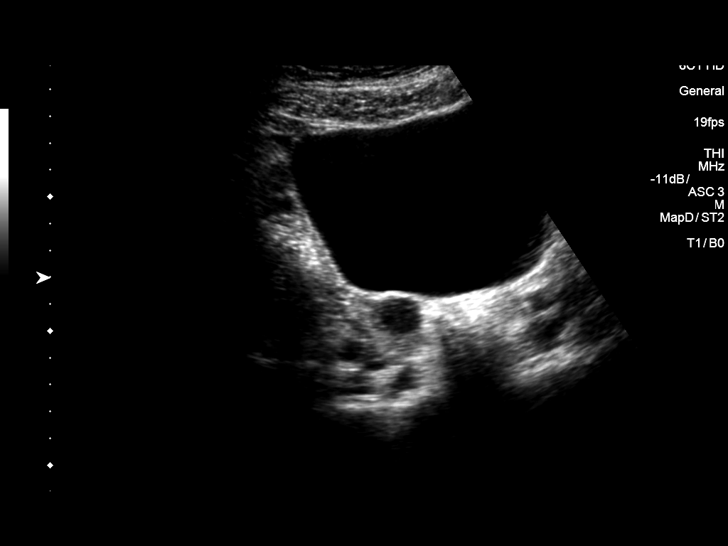
[im 17/66]
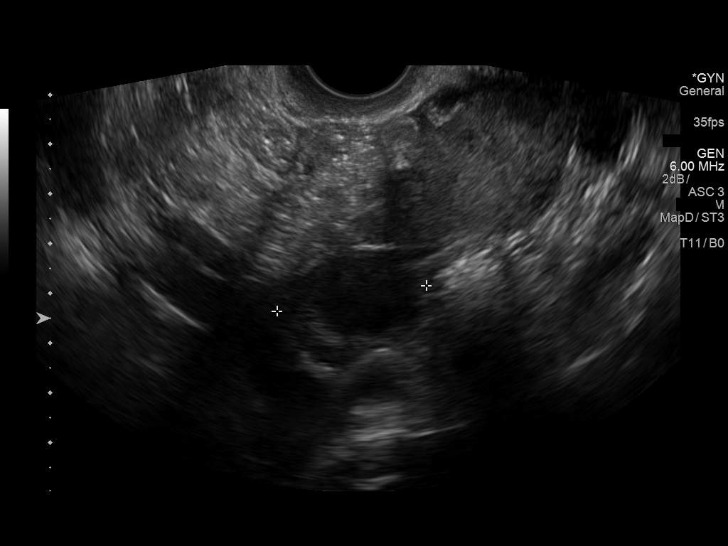
[im 22/66]
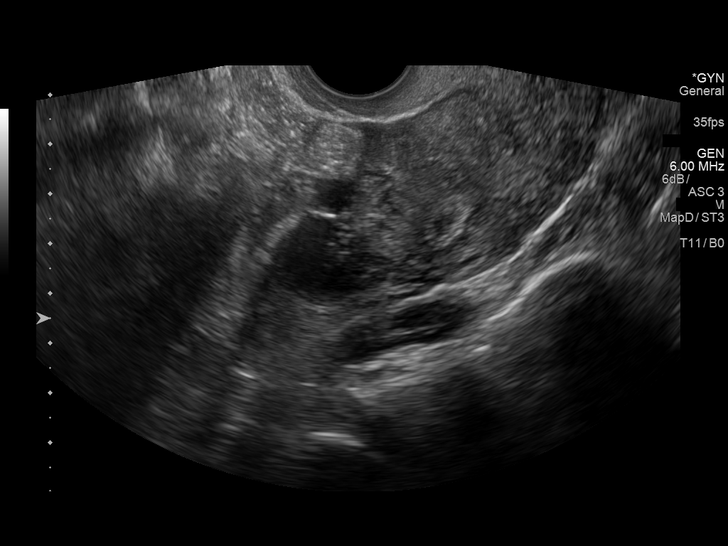
[im 28/66]
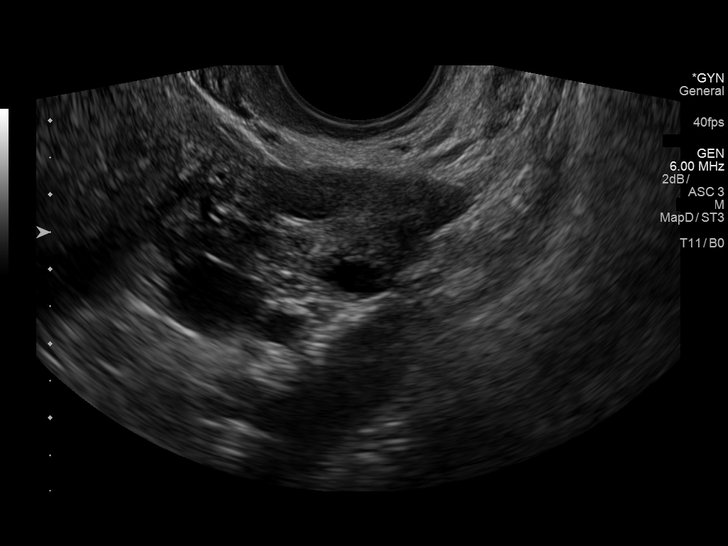
[im 33/66]
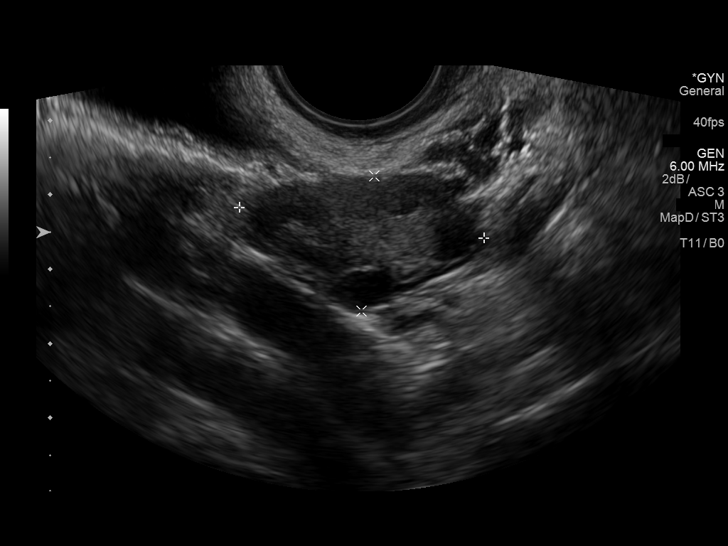
[im 38/66]
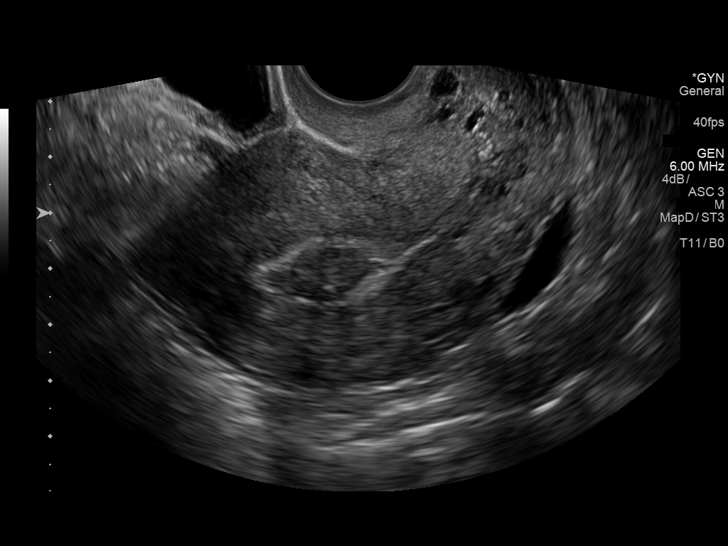
[im 44/66]
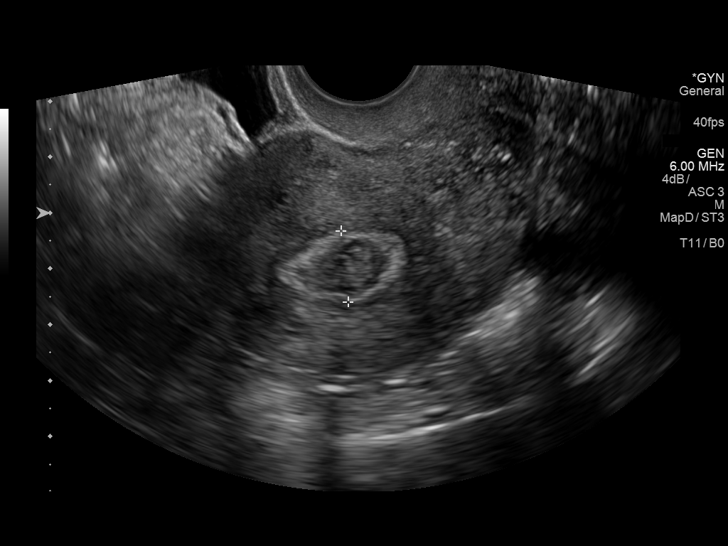
[im 49/66]
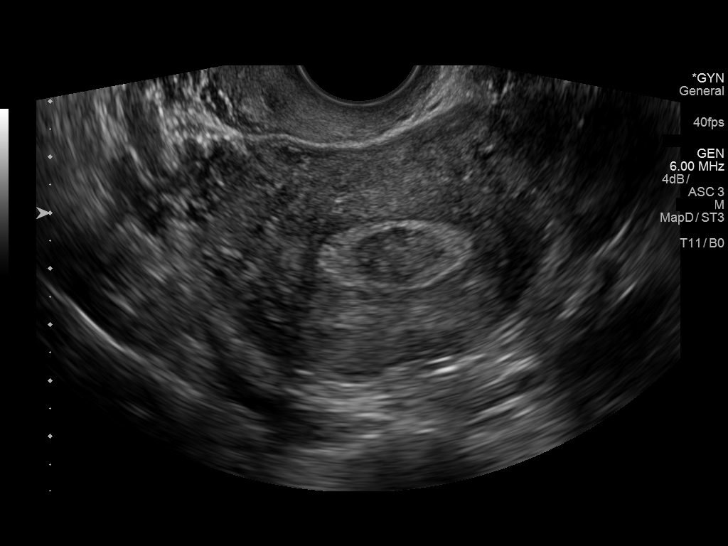
[im 55/66]
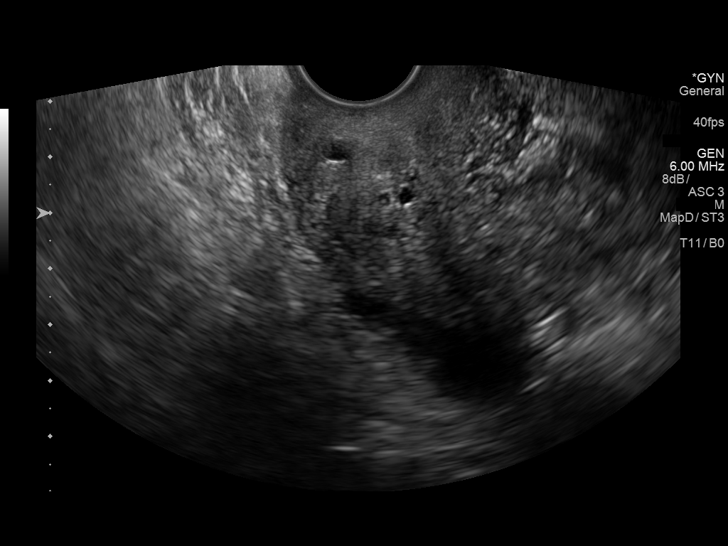
[im 60/66]
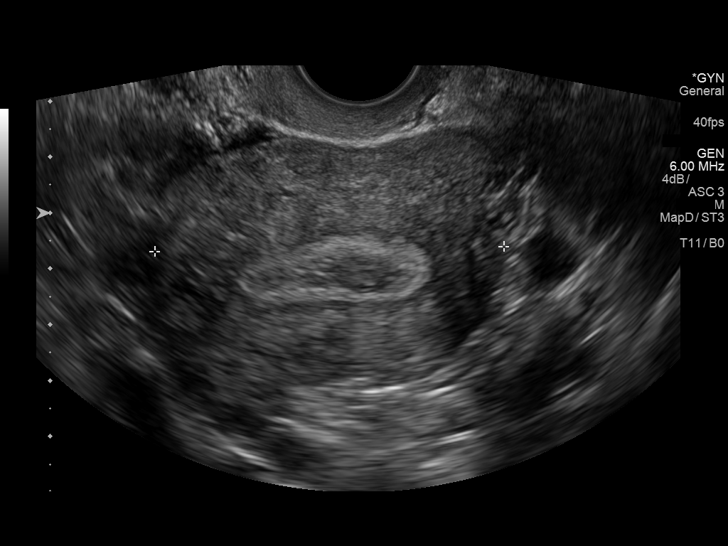
[im 66/66]
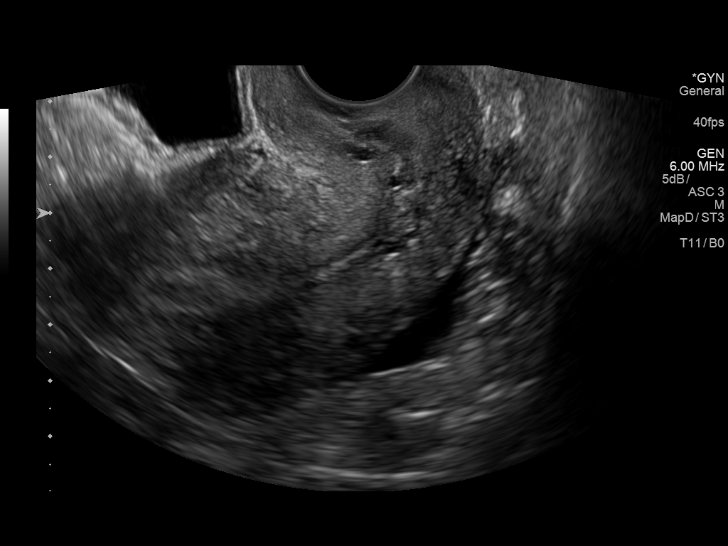

[13 of 25 positions shown; findings below may reference images not displayed]

FINDINGS: Uterus

Measurements: 8.6 x 4.1 x 5.6 cm.. The myometrial echotexture is
normal.

Endometrium

Thickness: 12.8 mm. Within the endometrial cavity there is a mixed
echogenicity hypoechoic mass measuring 1.7 x 0.9 x 1.5 cm.

Right ovary

Measurements: 3.3 x 2.2 x 3.1 cm. Within the right ovary there is a
cyst containing internal echoes measuring 2.3 x 1.8 x 1.9 cm.

Left ovary

Measurements: 3.3 x 1.8 x 3.1 cm. Normal appearance/no adnexal mass.

Other findings

There is a small amount of free fluid.
IMPRESSION: No uterine fibroids are identified. There is an abnormal mass within
the endometrium measuring 1.7 cm in greatest dimension. Consider
further evaluation with sonohysterogram for confirmation prior to
hysteroscopy. Endometrial sampling should also be considered if
patient is at high risk for endometrial carcinoma. (Ref:
Radiological Reasoning: Algorithmic Workup of Abnormal Vaginal
Bleeding with Endovaginal Sonography and Sonohysterography. AJR
0110; 191:[id number])

Non simple appearing right ovarian cystic structure. This likely
reflects a hemorrhagic cyst. Follow-up ultrasound in 8-12 weeks is
recommended. Normal-appearing left ovary.

There is a small amount of free pelvic fluid.

## 2019-11-05 DIAGNOSIS — Z23 Encounter for immunization: Secondary | ICD-10-CM | POA: Diagnosis not present

## 2019-11-23 DIAGNOSIS — K5732 Diverticulitis of large intestine without perforation or abscess without bleeding: Secondary | ICD-10-CM | POA: Diagnosis not present

## 2019-11-23 DIAGNOSIS — R1032 Left lower quadrant pain: Secondary | ICD-10-CM | POA: Diagnosis not present

## 2020-01-14 DIAGNOSIS — Z01419 Encounter for gynecological examination (general) (routine) without abnormal findings: Secondary | ICD-10-CM | POA: Diagnosis not present

## 2020-02-06 DIAGNOSIS — Z20822 Contact with and (suspected) exposure to covid-19: Secondary | ICD-10-CM | POA: Diagnosis not present

## 2020-02-20 DIAGNOSIS — M25562 Pain in left knee: Secondary | ICD-10-CM | POA: Diagnosis not present

## 2020-02-20 DIAGNOSIS — M25511 Pain in right shoulder: Secondary | ICD-10-CM | POA: Diagnosis not present

## 2020-02-20 DIAGNOSIS — M25552 Pain in left hip: Secondary | ICD-10-CM | POA: Diagnosis not present

## 2020-02-27 DIAGNOSIS — M25562 Pain in left knee: Secondary | ICD-10-CM | POA: Diagnosis not present

## 2020-02-27 DIAGNOSIS — M25552 Pain in left hip: Secondary | ICD-10-CM | POA: Diagnosis not present

## 2020-02-27 DIAGNOSIS — M25511 Pain in right shoulder: Secondary | ICD-10-CM | POA: Diagnosis not present

## 2020-03-03 DIAGNOSIS — M25511 Pain in right shoulder: Secondary | ICD-10-CM | POA: Diagnosis not present

## 2020-03-03 DIAGNOSIS — M25552 Pain in left hip: Secondary | ICD-10-CM | POA: Diagnosis not present

## 2020-03-03 DIAGNOSIS — M25562 Pain in left knee: Secondary | ICD-10-CM | POA: Diagnosis not present

## 2020-03-04 DIAGNOSIS — K122 Cellulitis and abscess of mouth: Secondary | ICD-10-CM | POA: Diagnosis not present

## 2020-03-12 DIAGNOSIS — M25511 Pain in right shoulder: Secondary | ICD-10-CM | POA: Diagnosis not present

## 2020-03-12 DIAGNOSIS — M25552 Pain in left hip: Secondary | ICD-10-CM | POA: Diagnosis not present

## 2020-03-12 DIAGNOSIS — M25562 Pain in left knee: Secondary | ICD-10-CM | POA: Diagnosis not present

## 2020-03-19 DIAGNOSIS — M25511 Pain in right shoulder: Secondary | ICD-10-CM | POA: Diagnosis not present

## 2020-03-19 DIAGNOSIS — M25562 Pain in left knee: Secondary | ICD-10-CM | POA: Diagnosis not present

## 2020-03-19 DIAGNOSIS — M25552 Pain in left hip: Secondary | ICD-10-CM | POA: Diagnosis not present

## 2020-03-26 DIAGNOSIS — M25552 Pain in left hip: Secondary | ICD-10-CM | POA: Diagnosis not present

## 2020-03-26 DIAGNOSIS — M25562 Pain in left knee: Secondary | ICD-10-CM | POA: Diagnosis not present

## 2020-03-26 DIAGNOSIS — M25511 Pain in right shoulder: Secondary | ICD-10-CM | POA: Diagnosis not present

## 2020-03-31 ENCOUNTER — Other Ambulatory Visit: Payer: Self-pay | Admitting: Physician Assistant

## 2020-03-31 DIAGNOSIS — I10 Essential (primary) hypertension: Secondary | ICD-10-CM | POA: Diagnosis not present

## 2020-03-31 DIAGNOSIS — Z1231 Encounter for screening mammogram for malignant neoplasm of breast: Secondary | ICD-10-CM

## 2020-04-03 DIAGNOSIS — M25552 Pain in left hip: Secondary | ICD-10-CM | POA: Diagnosis not present

## 2020-04-03 DIAGNOSIS — M25511 Pain in right shoulder: Secondary | ICD-10-CM | POA: Diagnosis not present

## 2020-04-03 DIAGNOSIS — M25562 Pain in left knee: Secondary | ICD-10-CM | POA: Diagnosis not present

## 2020-04-10 DIAGNOSIS — M25562 Pain in left knee: Secondary | ICD-10-CM | POA: Diagnosis not present

## 2020-04-10 DIAGNOSIS — M25552 Pain in left hip: Secondary | ICD-10-CM | POA: Diagnosis not present

## 2020-04-10 DIAGNOSIS — M25511 Pain in right shoulder: Secondary | ICD-10-CM | POA: Diagnosis not present

## 2020-04-17 DIAGNOSIS — M25562 Pain in left knee: Secondary | ICD-10-CM | POA: Diagnosis not present

## 2020-04-17 DIAGNOSIS — M25552 Pain in left hip: Secondary | ICD-10-CM | POA: Diagnosis not present

## 2020-04-17 DIAGNOSIS — M25511 Pain in right shoulder: Secondary | ICD-10-CM | POA: Diagnosis not present

## 2020-04-24 DIAGNOSIS — M25562 Pain in left knee: Secondary | ICD-10-CM | POA: Diagnosis not present

## 2020-04-24 DIAGNOSIS — M25552 Pain in left hip: Secondary | ICD-10-CM | POA: Diagnosis not present

## 2020-04-24 DIAGNOSIS — M25511 Pain in right shoulder: Secondary | ICD-10-CM | POA: Diagnosis not present

## 2020-05-01 DIAGNOSIS — M25552 Pain in left hip: Secondary | ICD-10-CM | POA: Diagnosis not present

## 2020-05-01 DIAGNOSIS — M25562 Pain in left knee: Secondary | ICD-10-CM | POA: Diagnosis not present

## 2020-05-01 DIAGNOSIS — M25511 Pain in right shoulder: Secondary | ICD-10-CM | POA: Diagnosis not present

## 2020-05-15 DIAGNOSIS — M25511 Pain in right shoulder: Secondary | ICD-10-CM | POA: Diagnosis not present

## 2020-05-15 DIAGNOSIS — M25552 Pain in left hip: Secondary | ICD-10-CM | POA: Diagnosis not present

## 2020-05-15 DIAGNOSIS — M25562 Pain in left knee: Secondary | ICD-10-CM | POA: Diagnosis not present

## 2020-05-22 ENCOUNTER — Ambulatory Visit
Admission: RE | Admit: 2020-05-22 | Discharge: 2020-05-22 | Disposition: A | Discharge status: Home / Self Care | Payer: BC Managed Care – PPO | Source: Ambulatory Visit | Attending: Physician Assistant | Admitting: Physician Assistant

## 2020-05-22 ENCOUNTER — Other Ambulatory Visit: Payer: Self-pay

## 2020-05-22 DIAGNOSIS — Z1231 Encounter for screening mammogram for malignant neoplasm of breast: Secondary | ICD-10-CM

## 2020-05-29 DIAGNOSIS — M25552 Pain in left hip: Secondary | ICD-10-CM | POA: Diagnosis not present

## 2020-05-29 DIAGNOSIS — M25511 Pain in right shoulder: Secondary | ICD-10-CM | POA: Diagnosis not present

## 2020-05-29 DIAGNOSIS — M25562 Pain in left knee: Secondary | ICD-10-CM | POA: Diagnosis not present

## 2020-06-12 DIAGNOSIS — M25552 Pain in left hip: Secondary | ICD-10-CM | POA: Diagnosis not present

## 2020-06-12 DIAGNOSIS — M25511 Pain in right shoulder: Secondary | ICD-10-CM | POA: Diagnosis not present

## 2020-06-12 DIAGNOSIS — M25562 Pain in left knee: Secondary | ICD-10-CM | POA: Diagnosis not present

## 2020-06-26 DIAGNOSIS — M25511 Pain in right shoulder: Secondary | ICD-10-CM | POA: Diagnosis not present

## 2020-06-26 DIAGNOSIS — M25552 Pain in left hip: Secondary | ICD-10-CM | POA: Diagnosis not present

## 2020-06-26 DIAGNOSIS — M25562 Pain in left knee: Secondary | ICD-10-CM | POA: Diagnosis not present

## 2020-07-03 DIAGNOSIS — M25562 Pain in left knee: Secondary | ICD-10-CM | POA: Diagnosis not present

## 2020-07-03 DIAGNOSIS — M25511 Pain in right shoulder: Secondary | ICD-10-CM | POA: Diagnosis not present

## 2020-07-03 DIAGNOSIS — M25552 Pain in left hip: Secondary | ICD-10-CM | POA: Diagnosis not present

## 2020-07-25 DIAGNOSIS — M25552 Pain in left hip: Secondary | ICD-10-CM | POA: Diagnosis not present

## 2020-07-25 DIAGNOSIS — M25511 Pain in right shoulder: Secondary | ICD-10-CM | POA: Diagnosis not present

## 2020-07-25 DIAGNOSIS — M25562 Pain in left knee: Secondary | ICD-10-CM | POA: Diagnosis not present

## 2020-08-05 DIAGNOSIS — M25562 Pain in left knee: Secondary | ICD-10-CM | POA: Diagnosis not present

## 2020-08-05 DIAGNOSIS — M25552 Pain in left hip: Secondary | ICD-10-CM | POA: Diagnosis not present

## 2020-08-05 DIAGNOSIS — M25511 Pain in right shoulder: Secondary | ICD-10-CM | POA: Diagnosis not present

## 2020-08-21 DIAGNOSIS — M25562 Pain in left knee: Secondary | ICD-10-CM | POA: Diagnosis not present

## 2020-08-21 DIAGNOSIS — M25511 Pain in right shoulder: Secondary | ICD-10-CM | POA: Diagnosis not present

## 2020-08-21 DIAGNOSIS — M25552 Pain in left hip: Secondary | ICD-10-CM | POA: Diagnosis not present

## 2020-09-03 DIAGNOSIS — M545 Low back pain, unspecified: Secondary | ICD-10-CM | POA: Diagnosis not present

## 2020-09-03 DIAGNOSIS — M7652 Patellar tendinitis, left knee: Secondary | ICD-10-CM | POA: Diagnosis not present

## 2020-09-03 DIAGNOSIS — M7632 Iliotibial band syndrome, left leg: Secondary | ICD-10-CM | POA: Diagnosis not present

## 2020-09-04 DIAGNOSIS — M25511 Pain in right shoulder: Secondary | ICD-10-CM | POA: Diagnosis not present

## 2020-09-04 DIAGNOSIS — M25552 Pain in left hip: Secondary | ICD-10-CM | POA: Diagnosis not present

## 2020-09-04 DIAGNOSIS — M25562 Pain in left knee: Secondary | ICD-10-CM | POA: Diagnosis not present

## 2020-09-19 DIAGNOSIS — M25552 Pain in left hip: Secondary | ICD-10-CM | POA: Diagnosis not present

## 2020-09-19 DIAGNOSIS — M25511 Pain in right shoulder: Secondary | ICD-10-CM | POA: Diagnosis not present

## 2020-09-19 DIAGNOSIS — M25562 Pain in left knee: Secondary | ICD-10-CM | POA: Diagnosis not present

## 2020-10-02 DIAGNOSIS — M25552 Pain in left hip: Secondary | ICD-10-CM | POA: Diagnosis not present

## 2020-10-02 DIAGNOSIS — M25562 Pain in left knee: Secondary | ICD-10-CM | POA: Diagnosis not present

## 2020-10-02 DIAGNOSIS — M25511 Pain in right shoulder: Secondary | ICD-10-CM | POA: Diagnosis not present

## 2020-10-10 DIAGNOSIS — Z0000 Encounter for general adult medical examination without abnormal findings: Secondary | ICD-10-CM | POA: Diagnosis not present

## 2020-10-10 DIAGNOSIS — Z23 Encounter for immunization: Secondary | ICD-10-CM | POA: Diagnosis not present

## 2020-10-10 DIAGNOSIS — I10 Essential (primary) hypertension: Secondary | ICD-10-CM | POA: Diagnosis not present

## 2020-10-10 DIAGNOSIS — Z13220 Encounter for screening for lipoid disorders: Secondary | ICD-10-CM | POA: Diagnosis not present

## 2020-10-23 DIAGNOSIS — M25552 Pain in left hip: Secondary | ICD-10-CM | POA: Diagnosis not present

## 2020-10-23 DIAGNOSIS — M25562 Pain in left knee: Secondary | ICD-10-CM | POA: Diagnosis not present

## 2020-10-23 DIAGNOSIS — M25511 Pain in right shoulder: Secondary | ICD-10-CM | POA: Diagnosis not present

## 2020-11-06 DIAGNOSIS — M25552 Pain in left hip: Secondary | ICD-10-CM | POA: Diagnosis not present

## 2020-11-06 DIAGNOSIS — M25511 Pain in right shoulder: Secondary | ICD-10-CM | POA: Diagnosis not present

## 2020-11-06 DIAGNOSIS — M25562 Pain in left knee: Secondary | ICD-10-CM | POA: Diagnosis not present

## 2020-11-19 DIAGNOSIS — M25552 Pain in left hip: Secondary | ICD-10-CM | POA: Diagnosis not present

## 2020-11-19 DIAGNOSIS — M25562 Pain in left knee: Secondary | ICD-10-CM | POA: Diagnosis not present

## 2020-11-19 DIAGNOSIS — M25511 Pain in right shoulder: Secondary | ICD-10-CM | POA: Diagnosis not present

## 2020-12-22 DIAGNOSIS — M25562 Pain in left knee: Secondary | ICD-10-CM | POA: Diagnosis not present

## 2020-12-22 DIAGNOSIS — M25552 Pain in left hip: Secondary | ICD-10-CM | POA: Diagnosis not present

## 2020-12-22 DIAGNOSIS — M25511 Pain in right shoulder: Secondary | ICD-10-CM | POA: Diagnosis not present

## 2021-05-22 ENCOUNTER — Other Ambulatory Visit: Payer: Self-pay | Admitting: Physician Assistant

## 2021-05-22 DIAGNOSIS — Z1231 Encounter for screening mammogram for malignant neoplasm of breast: Secondary | ICD-10-CM

## 2021-05-29 ENCOUNTER — Ambulatory Visit
Admission: RE | Admit: 2021-05-29 | Discharge: 2021-05-29 | Disposition: A | Discharge status: Home / Self Care | Payer: PRIVATE HEALTH INSURANCE | Source: Ambulatory Visit | Attending: Physician Assistant | Admitting: Physician Assistant

## 2021-05-29 DIAGNOSIS — Z1231 Encounter for screening mammogram for malignant neoplasm of breast: Secondary | ICD-10-CM

## 2021-06-02 ENCOUNTER — Other Ambulatory Visit: Payer: Self-pay | Admitting: Physician Assistant

## 2021-06-02 DIAGNOSIS — R928 Other abnormal and inconclusive findings on diagnostic imaging of breast: Secondary | ICD-10-CM

## 2021-06-10 ENCOUNTER — Ambulatory Visit
Admission: RE | Admit: 2021-06-10 | Discharge: 2021-06-10 | Disposition: A | Discharge status: Home / Self Care | Payer: PRIVATE HEALTH INSURANCE | Source: Ambulatory Visit | Attending: Physician Assistant | Admitting: Physician Assistant

## 2021-06-10 DIAGNOSIS — R928 Other abnormal and inconclusive findings on diagnostic imaging of breast: Secondary | ICD-10-CM

## 2022-04-16 ENCOUNTER — Other Ambulatory Visit: Payer: Self-pay | Admitting: Physician Assistant

## 2022-04-16 DIAGNOSIS — Z1231 Encounter for screening mammogram for malignant neoplasm of breast: Secondary | ICD-10-CM

## 2022-05-31 ENCOUNTER — Ambulatory Visit
Admission: RE | Admit: 2022-05-31 | Discharge: 2022-05-31 | Disposition: A | Discharge status: Home / Self Care | Payer: PRIVATE HEALTH INSURANCE | Source: Ambulatory Visit | Attending: Physician Assistant | Admitting: Physician Assistant

## 2022-05-31 DIAGNOSIS — Z1231 Encounter for screening mammogram for malignant neoplasm of breast: Secondary | ICD-10-CM

## 2022-06-23 ENCOUNTER — Emergency Department (HOSPITAL_COMMUNITY): Payer: Managed Care, Other (non HMO)

## 2022-06-23 ENCOUNTER — Emergency Department (HOSPITAL_COMMUNITY)
Admission: EM | Admit: 2022-06-23 | Discharge: 2022-06-23 | Disposition: A | Discharge status: Home / Self Care | Payer: Managed Care, Other (non HMO) | Attending: Emergency Medicine | Admitting: Emergency Medicine

## 2022-06-23 ENCOUNTER — Other Ambulatory Visit: Payer: Self-pay

## 2022-06-23 ENCOUNTER — Encounter (HOSPITAL_COMMUNITY): Payer: Self-pay

## 2022-06-23 DIAGNOSIS — Z79899 Other long term (current) drug therapy: Secondary | ICD-10-CM | POA: Diagnosis not present

## 2022-06-23 DIAGNOSIS — D72829 Elevated white blood cell count, unspecified: Secondary | ICD-10-CM | POA: Diagnosis not present

## 2022-06-23 DIAGNOSIS — R1032 Left lower quadrant pain: Secondary | ICD-10-CM

## 2022-06-23 DIAGNOSIS — I10 Essential (primary) hypertension: Secondary | ICD-10-CM | POA: Insufficient documentation

## 2022-06-23 DIAGNOSIS — K5792 Diverticulitis of intestine, part unspecified, without perforation or abscess without bleeding: Secondary | ICD-10-CM | POA: Insufficient documentation

## 2022-06-23 LAB — URINALYSIS, ROUTINE W REFLEX MICROSCOPIC
Bilirubin Urine: NEGATIVE
Glucose, UA: NEGATIVE mg/dL
Hgb urine dipstick: NEGATIVE
Ketones, ur: 5 mg/dL — AB
Leukocytes,Ua: NEGATIVE
Nitrite: NEGATIVE
Protein, ur: NEGATIVE mg/dL
Specific Gravity, Urine: 1.01 (ref 1.005–1.030)
pH: 7 (ref 5.0–8.0)

## 2022-06-23 LAB — CBC
HCT: 43 % (ref 36.0–46.0)
Hemoglobin: 15 g/dL (ref 12.0–15.0)
MCH: 32.6 pg (ref 26.0–34.0)
MCHC: 34.9 g/dL (ref 30.0–36.0)
MCV: 93.5 fL (ref 80.0–100.0)
Platelets: 237 10*3/uL (ref 150–400)
RBC: 4.6 MIL/uL (ref 3.87–5.11)
RDW: 12.6 % (ref 11.5–15.5)
WBC: 14.1 10*3/uL — ABNORMAL HIGH (ref 4.0–10.5)
nRBC: 0 % (ref 0.0–0.2)

## 2022-06-23 LAB — COMPREHENSIVE METABOLIC PANEL
ALT: 16 U/L (ref 0–44)
AST: 20 U/L (ref 15–41)
Albumin: 4 g/dL (ref 3.5–5.0)
Alkaline Phosphatase: 51 U/L (ref 38–126)
Anion gap: 12 (ref 5–15)
BUN: 8 mg/dL (ref 6–20)
CO2: 22 mmol/L (ref 22–32)
Calcium: 9.5 mg/dL (ref 8.9–10.3)
Chloride: 102 mmol/L (ref 98–111)
Creatinine, Ser: 0.75 mg/dL (ref 0.44–1.00)
GFR, Estimated: >60 mL/min (ref >60)
Glucose, Bld: 100 mg/dL — ABNORMAL HIGH (ref 70–99)
Potassium: 3.6 mmol/L (ref 3.5–5.1)
Sodium: 136 mmol/L (ref 135–145)
Total Bilirubin: 0.9 mg/dL (ref 0.3–1.2)
Total Protein: 7.4 g/dL (ref 6.5–8.1)

## 2022-06-23 LAB — LIPASE, BLOOD: Lipase: 29 U/L (ref 11–51)

## 2022-06-23 LAB — I-STAT BETA HCG BLOOD, ED (MC, WL, AP ONLY): I-stat hCG, quantitative: <5 m[IU]/mL (ref <5)

## 2022-06-23 MED ORDER — IOHEXOL 350 MG/ML SOLN
75.0000 mL | Freq: Once | INTRAVENOUS | Status: AC | PRN
  Administered 2022-06-23: 75 mL via INTRAVENOUS

## 2022-06-23 MED ORDER — METRONIDAZOLE 500 MG PO TABS: 500.0000 mg | ORAL_TABLET | Freq: Three times a day (TID) | ORAL | 0 refills | Status: DC

## 2022-06-23 MED ORDER — KETOROLAC TROMETHAMINE 15 MG/ML IJ SOLN
15.0000 mg | Freq: Once | INTRAMUSCULAR | Status: AC
  Administered 2022-06-23: 15 mg via INTRAVENOUS
  Filled 2022-06-23: qty 1

## 2022-06-23 MED ORDER — IBUPROFEN 600 MG PO TABS: 600.0000 mg | ORAL_TABLET | Freq: Four times a day (QID) | ORAL | 0 refills | Status: AC | PRN

## 2022-06-23 MED ORDER — METRONIDAZOLE 500 MG PO TABS
500.0000 mg | ORAL_TABLET | Freq: Once | ORAL | Status: AC
  Administered 2022-06-23: 500 mg via ORAL
  Filled 2022-06-23: qty 1

## 2022-06-23 MED ORDER — CIPROFLOXACIN HCL 500 MG PO TABS: 500.0000 mg | ORAL_TABLET | Freq: Two times a day (BID) | ORAL | 0 refills | Status: DC

## 2022-06-23 MED ORDER — METRONIDAZOLE 500 MG PO TABS: 500.0000 mg | ORAL_TABLET | Freq: Three times a day (TID) | ORAL | 0 refills | Status: AC

## 2022-06-23 MED ORDER — CIPROFLOXACIN HCL 500 MG PO TABS
500.0000 mg | ORAL_TABLET | Freq: Once | ORAL | Status: AC
  Administered 2022-06-23: 500 mg via ORAL
  Filled 2022-06-23: qty 1

## 2022-06-23 MED ORDER — CIPROFLOXACIN HCL 500 MG PO TABS: 500.0000 mg | ORAL_TABLET | Freq: Two times a day (BID) | ORAL | 0 refills | Status: AC

## 2022-06-23 MED ORDER — IBUPROFEN 600 MG PO TABS: 600.0000 mg | ORAL_TABLET | Freq: Four times a day (QID) | ORAL | 0 refills | Status: DC | PRN

## 2022-06-23 NOTE — ED Triage Notes (Signed)
Patient reports LLQ abdominal pain onset yesterday , no emesis or diarrhea , denies fever or urinary discomfort.

## 2022-06-23 NOTE — Discharge Instructions (Signed)
As discussed, your symptoms today are most likely secondary to diverticulitis.  Regarding this condition, will place on 2 different antibiotics.  Please take as directed.  See attached information regarding dietary recommendations for helping your body with the healing process.  Recommend following up with GI in the outpatient setting for reevaluation of your symptoms or least primary care if you are not able to get in with GI in a reasonable amount of time.  Please not hesitate to return to emergency department for worrisome signs or symptoms we discussed to become apparent.Jessica Ruiz

## 2022-06-23 NOTE — ED Provider Notes (Signed)
[place] EMERGENCY DEPARTMENT AT [facility] Provider Note   CSN: [id number] Arrival date & time: 06/23/22  0141     History  Chief Complaint  Patient presents with   Abdominal Pain    Jessica Ruiz is a 47 y.o. female.   Abdominal Pain   47 year old female presents emergency department with complaints of left-sided flank pain.  Patient reports that symptoms beginning Tuesday morning of which initially were slight but have worsened since onset.  Reports pain in left low back with radiation to left inguinal region.  Denies history of similar symptoms in the past.  Denies fever, chills, chest pain, shortness of breath, nausea, vomiting, urinary symptoms, change in bowel habits, vaginal symptoms.  Last bowel movement yesterday.  .  Past medical history significant for abnormal uterine bleeding, hypertension, OSA  Home Medications Prior to Admission medications   Medication Sig Start Date End Date Taking? Authorizing Provider  ciprofloxacin (CIPRO) 500 MG tablet Take 1 tablet (500 mg total) by mouth every 12 (twelve) hours. 06/23/22  Yes Sherian Maroon A, PA  ibuprofen (ADVIL) 600 MG tablet Take 1 tablet (600 mg total) by mouth every 6 (six) hours as needed. 06/23/22  Yes Sherian Maroon A, PA  metroNIDAZOLE (FLAGYL) 500 MG tablet Take 1 tablet (500 mg total) by mouth 3 (three) times daily. 06/23/22  Yes Sherian Maroon A, PA  lisinopril (ZESTRIL) 40 MG tablet Take 40 mg by mouth daily. 10/10/18   [provider]  Multiple Vitamin (MULTIVITAMIN WITH MINERALS) TABS tablet Take 1 tablet by mouth daily.    [provider]  norethindrone (MICRONOR,CAMILA,ERRIN) 0.35 MG tablet Take 1 tablet by mouth daily.    [provider]  ondansetron (ZOFRAN) 8 MG tablet Take 8 mg by mouth every 8 (eight) hours as needed for nausea or vomiting.    [provider]  SUMAtriptan (IMITREX) 100 MG tablet Take 1 tablet (100 mg total) by mouth every 6 (six) hours as  needed for migraine. May repeat in 2 hours if headache persists or recurs. 11/19/18   Charlynne Pander, MD      Allergies    Sulfa antibiotics    Review of Systems   Review of Systems  Gastrointestinal:  Positive for abdominal pain.  All other systems reviewed and are negative.   Physical Exam Updated Vital Signs BP (!) 146/115 (BP Location: Right Arm)   Pulse 79   Temp 98.2 F (36.8 C) (Oral)   Resp 16   SpO2 98%  Physical Exam Vitals and nursing note reviewed.  Constitutional:      General: She is not in acute distress.    Appearance: She is well-developed.  HENT:     Head: Normocephalic and atraumatic.  Eyes:     Conjunctiva/sclera: Conjunctivae normal.  Cardiovascular:     Rate and Rhythm: Normal rate and regular rhythm.     Heart sounds: No murmur heard. Pulmonary:     Effort: Pulmonary effort is normal. No respiratory distress.     Breath sounds: Normal breath sounds.  Abdominal:     Palpations: Abdomen is soft.     Tenderness: There is abdominal tenderness in the left lower quadrant. There is left CVA tenderness. Negative signs include Murphy's sign and McBurney's sign.  Musculoskeletal:        General: No swelling.     Cervical back: Neck supple.  Skin:    General: Skin is warm and dry.     Capillary Refill: Capillary refill takes  less than 2 seconds.  Neurological:     Mental Status: She is alert.  Psychiatric:        Mood and Affect: Mood normal.     ED Results / Procedures / Treatments   Labs (all labs ordered are listed, but only abnormal results are displayed) Labs Reviewed  COMPREHENSIVE METABOLIC PANEL - Abnormal; Notable for the following components:      Result Value   Glucose, Bld 100 (*)    All other components within normal limits  CBC - Abnormal; Notable for the following components:   WBC 14.1 (*)    All other components within normal limits  URINALYSIS, ROUTINE W REFLEX MICROSCOPIC - Abnormal; Notable for the following components:    Ketones, ur 5 (*)    All other components within normal limits  LIPASE, BLOOD  I-STAT BETA HCG BLOOD, ED ([facility], [facility], [facility] ONLY)    EKG None  Radiology CT ABDOMEN PELVIS W CONTRAST  Result Date: 06/23/2022 CLINICAL DATA:  Left lower quadrant pain onset yesterday.  No fever. EXAM: CT ABDOMEN AND PELVIS WITH CONTRAST TECHNIQUE: Multidetector CT imaging of the abdomen and pelvis was performed using the standard protocol following bolus administration of intravenous contrast. RADIATION DOSE REDUCTION: This exam was performed according to the departmental dose-optimization program which includes automated exposure control, adjustment of the mA and/or kV according to patient size and/or use of iterative reconstruction technique. CONTRAST:  75mL OMNIPAQUE IOHEXOL 350 MG/ML SOLN COMPARISON:  None Available. FINDINGS: Lower chest: There are linear atelectatic changes in both posterior lower lobes. Lung bases are clear of infiltrates with mild elevation of the right diaphragm. The cardiac size is normal. Hepatobiliary: The liver is 18 cm length and mildly steatotic without mass enhancement. The gallbladder and bile ducts are unremarkable. Pancreas: No abnormality. Spleen: No abnormality. Adrenals/Urinary Tract: Adrenal glands are unremarkable. Kidneys are normal, without renal calculi, focal lesion, or hydronephrosis. Bladder is unremarkable allowing for the degree of distention. Stomach/Bowel: No small bowel obstruction or inflammation. Normal gastric wall. Normal appendix. Moderate stool retention ascending and transverse colon. There is diffuse diverticulosis. There is a short segment of proximal descending colon with moderate wall thickening and inflammatory reaction. This is probably a short-segment acute diverticulitis. Colonoscopy follow-up is recommended after treatment to exclude underlying lesion. Rest of the diverticula are uncomplicated. The rectal wall unremarkable. Vascular/Lymphatic: No significant  vascular findings are present. No enlarged abdominal or pelvic lymph nodes. Reproductive: The uterus is mildly prominent and heterogeneous consistent with a fibroid morphology. The ovaries are not enlarged. Other: There is trace reactive fluid in the left pericolic gutter from the above inflammatory process but it is nonlocalizing. There is trace low-density fluid tracking into the presacral deep pelvis. There is no free hemorrhage, free air or abscess. There are no incarcerated hernias. Musculoskeletal: Mild degenerative change thoracic and lumbar spine. No acute osseous abnormality or aggressive lesions. IMPRESSION: 1. Diffuse diverticulosis with a short-segment of proximal descending colon with moderate wall thickening and inflammatory reaction. This is probably a short-segment acute diverticulitis. Colonoscopy follow-up is recommended after treatment to exclude underlying lesion. 2. Trace reactive fluid in the left pericolic gutter and presacral deep pelvis. No free hemorrhage, free air or abscess. 3. Constipation.  Diffuse diverticulosis elsewhere as well. 4. Mildly prominent and heterogeneous uterus consistent with fibroid morphology. 5. Mildly prominent liver with mild steatosis. Electronically Signed   By: Almira Bar M.D.   On: 06/23/2022 05:02    Procedures Procedures    Medications Ordered  in ED Medications  metroNIDAZOLE (FLAGYL) tablet 500 mg (has no administration in time range)  ciprofloxacin (CIPRO) tablet 500 mg (has no administration in time range)  ketorolac (TORADOL) 15 MG/ML injection 15 mg (15 mg Intravenous Given 06/23/22 0418)  iohexol (OMNIPAQUE) 350 MG/ML injection 75 mL (75 mLs Intravenous Contrast Given 06/23/22 0426)    ED Course/ Medical Decision Making/ A&P Clinical Course as of 06/23/22 0527  Wed Jun 23, 2022  0523 Reevaluation of the patient showed significant improvement in symptoms after administration of Toradol.  CT imaging with evidence of diverticulitis  without obvious complication.  Plan to discharge with oral antibiotics and close follow-up with GI in the outpatient setting. [CR]    Clinical Course User Index [CR] Peter Garter, PA                             Medical Decision Making Amount and/or Complexity of Data Reviewed Labs: ordered. Radiology: ordered.  Risk Prescription drug management.   This patient presents to the ED for concern of abdominal pain, this involves an extensive number of treatment options, and is a complaint that carries with it a high risk of complications and morbidity.  The differential diagnosis includes gastritis, PUD, cholecystitis, CBD pathology, SBO/LBO, volvulus, diverticulitis, appendicitis, pyelonephritis, nephrolithiasis, ectopic pregnancy, ovarian torsion, cystitis, mesenteric ischemia, PID    Co morbidities that complicate the patient evaluation  See HPI   Additional history obtained:  Additional history obtained from EMR External records from outside source obtained and reviewed including hospital records   Lab Tests:  I Ordered, and personally interpreted labs.  The pertinent results include: Leukocytosis of 14.1.  No evidence of anemia.  Platelets within normal range.  No Electra abnormalities.  No transaminitis.  UA significant for 5 ketones but otherwise unremarkable.  Lipase within normal limits.  Beta-hCG negative.   Imaging Studies ordered:  I ordered imaging studies including CT abdomen pelvis I independently visualized and interpreted imaging which showed diverticulosis with short segment of proximal descending colon with moderate wall thickening inflammatory reaction.  Trace reactive fluid in left paracolic gutter and presacral deep pelvis.  Constipation, diffuse diverticulosis.  Constipation.  Mild prominent heterogeneous uterus consistent with fibroid morphology.  Mild prominent liver with mild steatosis. I agree with the radiologist interpretation  Cardiac Monitoring:  / EKG:  The patient was maintained on a cardiac monitor.  I personally viewed and interpreted the cardiac monitored which showed an underlying rhythm of: Sinus rhythm   Consultations Obtained:  N/a   Problem List / ED Course / Critical interventions / Medication management  Left lower quadrant abdominal pain, diverticulitis I ordered medication including Toradol, Cipro, Flagyl  Reevaluation of the patient after these medicines showed that the patient improved I have reviewed the patients home medicines and have made adjustments as needed   Social Determinants of Health:  Denies tobacco, illicit drug use   Test / Admission - Considered:  Left lower quadrant abdominal pain, diverticulitis Vitals signs significant for mild hypertension with blood pressure 146/115. Otherwise within normal range and stable throughout visit. Laboratory/imaging studies significant for: See see above 47 year old female presents emergency department with complaints of 2-day history of left lower quadrant abdominal pain.  Patient's workup today overall consistent with diverticulitis most likely causing patient's symptoms.  Patient without meeting of SIRS criteria or with evidence of complications concerning diverticulitis such as abscess, perforation, peritoneal signs.  Will treat patient with oral antibiotics in  the outpatient setting as well as recommend liquid diet and close follow-up with GI for reassessment of patient's symptoms.  Patient overall well-appearing, afebrile in no acute distress.  Treatment plan discussed at length with patient and she acknowledged understanding was agreeable to said plan. Worrisome signs and symptoms were discussed with the patient, and the patient acknowledged understanding to return to the ED if noticed. Patient was stable upon discharge.          Final Clinical Impression(s) / ED Diagnoses Final diagnoses:  Diverticulitis    Rx / DC Orders ED Discharge Orders           Ordered    ciprofloxacin (CIPRO) 500 MG tablet  Every 12 hours        06/23/22 0509    metroNIDAZOLE (FLAGYL) 500 MG tablet  3 times daily        06/23/22 0509    ibuprofen (ADVIL) 600 MG tablet  Every 6 hours PRN        06/23/22 0511              Peter Garter, PA 06/23/22 1610    Gloris Manchester, MD 06/23/22 [phone]

## 2022-12-15 IMAGING — MG DIGITAL SCREENING BILAT W/ CAD
5 series · 5 of 5 positions shown · non-contrast
Comparison: Previous exam(s).

CLINICAL DATA: Screening.

EXAM:
DIGITAL SCREENING BILATERAL MAMMOGRAM WITH CAD
TECHNIQUE: Bilateral screening digital craniocaudal and mediolateral oblique
mammograms were obtained. The images were evaluated with
computer-aided detection.

[L MLO]
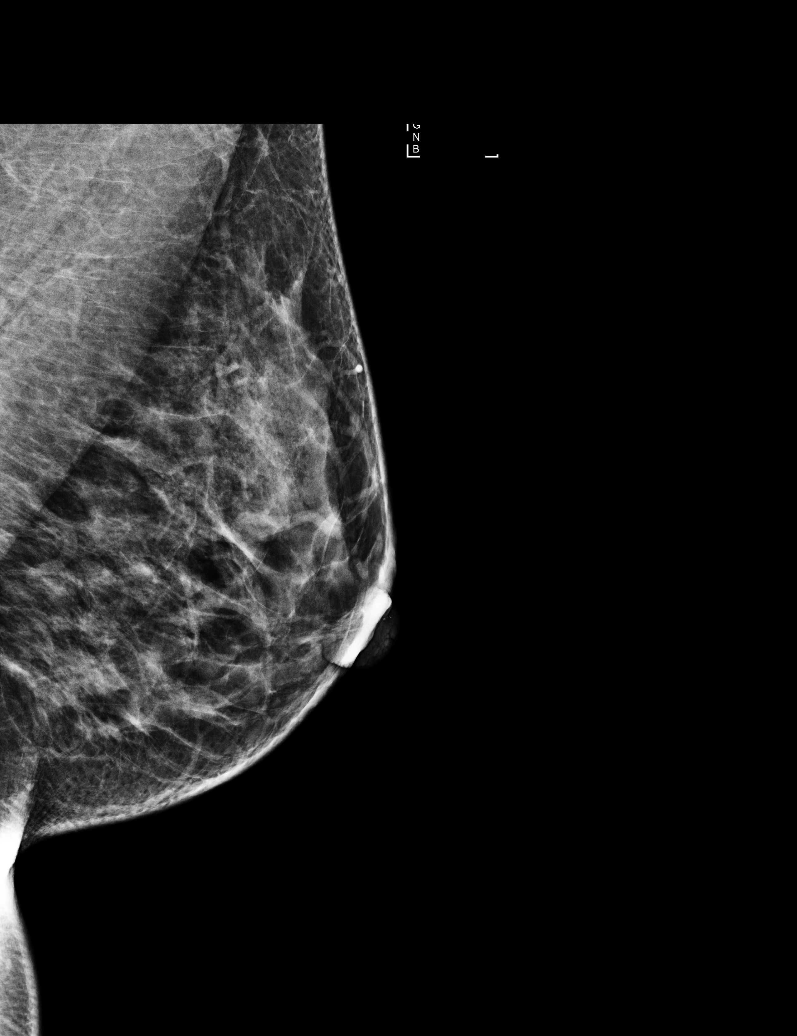

[R MLO]
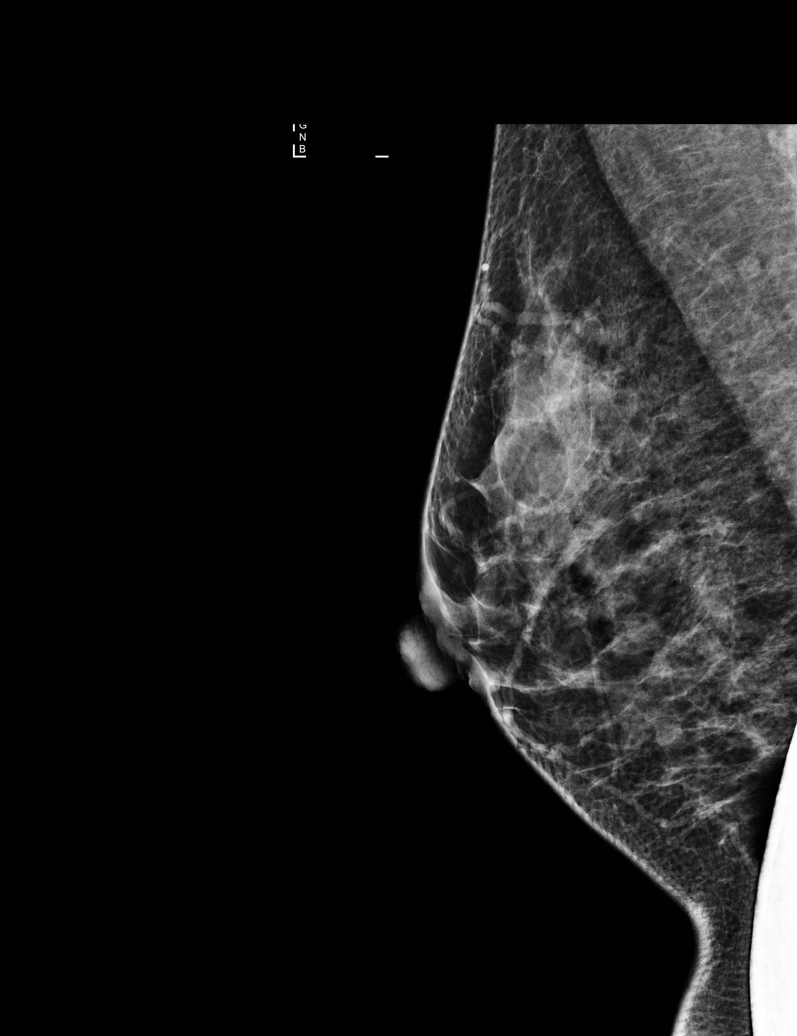

[R CC (1 of 2)]
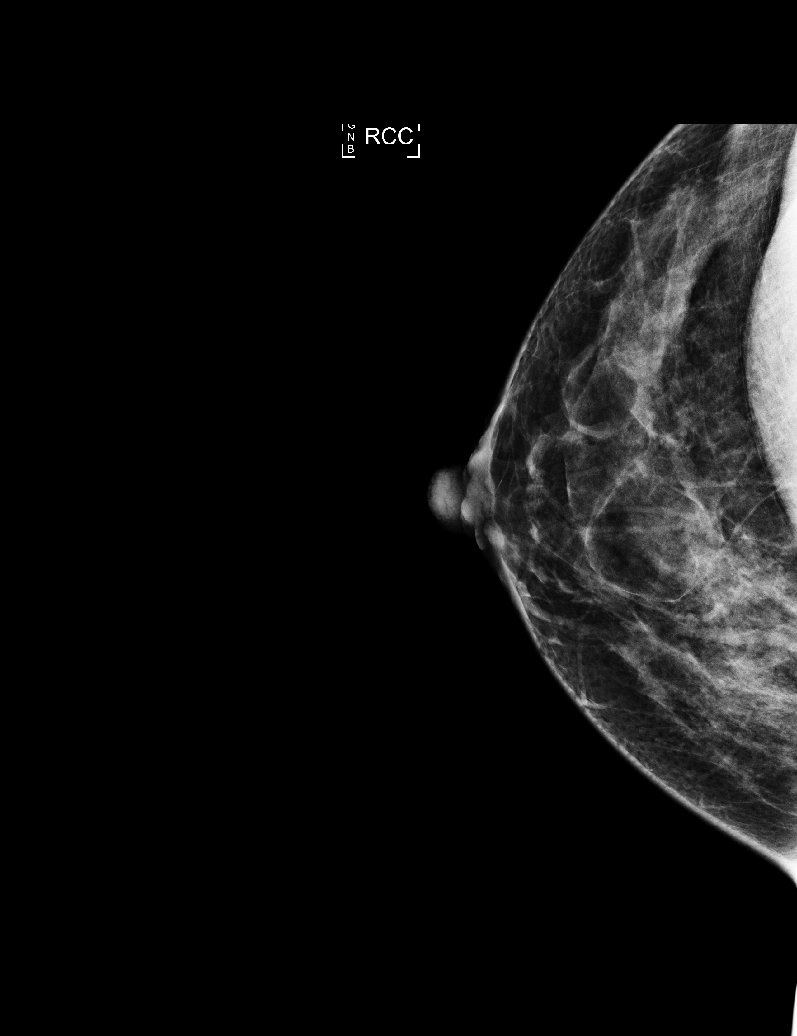

[R CC (2 of 2)]
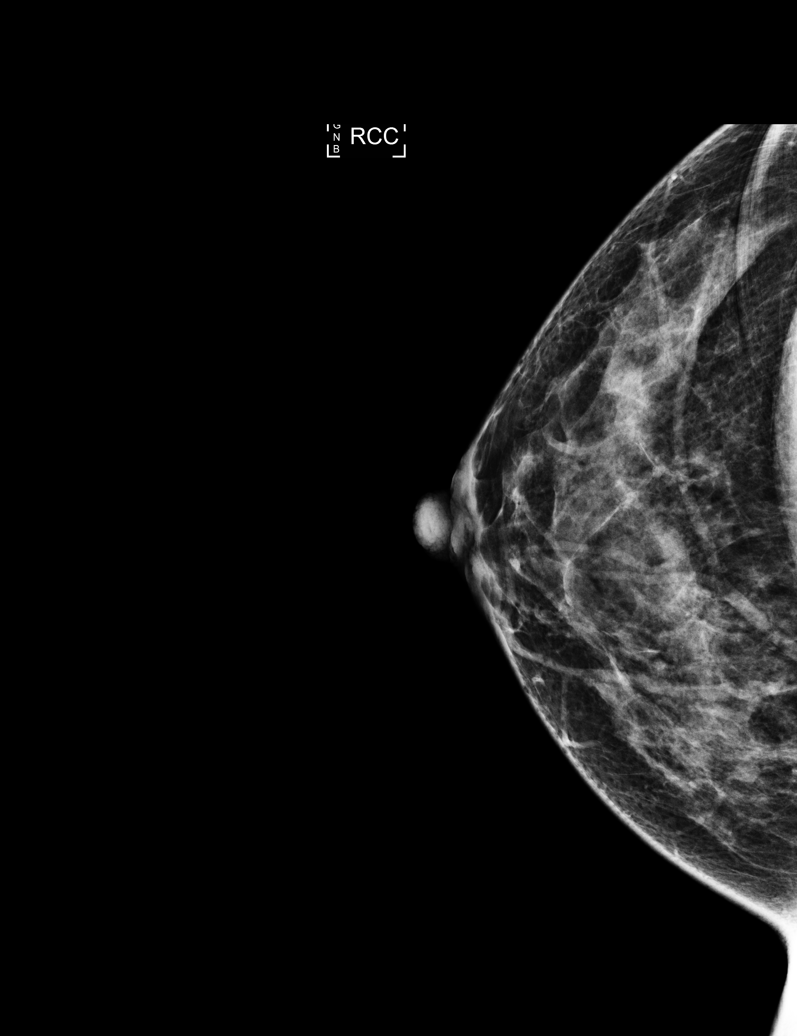

[L CC]
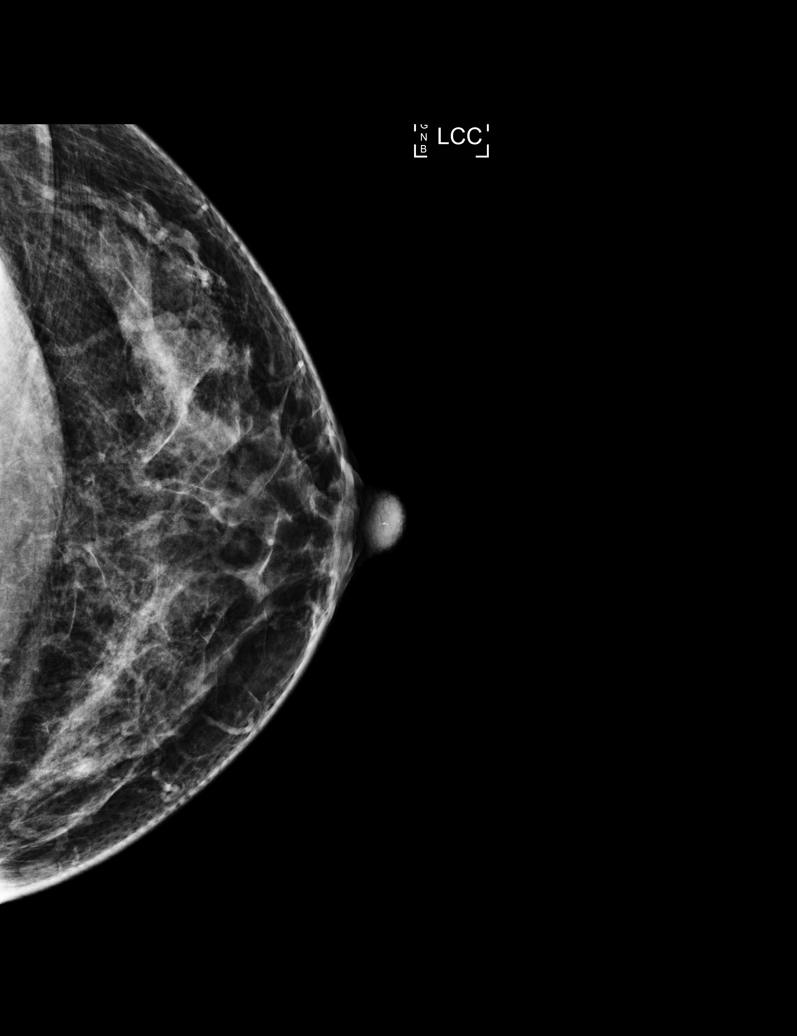

[5 of 5 positions shown; findings below may reference images not displayed]

ACR Breast Density Category c: The breast tissue is heterogeneously
dense, which may obscure small masses.
FINDINGS: There are no findings suspicious for malignancy. The images were
evaluated with computer-aided detection.
IMPRESSION: No mammographic evidence of malignancy. A result letter of this
screening mammogram will be mailed directly to the patient.

RECOMMENDATION:
Screening mammogram in one year. (Code:WI-W-Z4A)

BI-RADS CATEGORY  1: Negative.

## 2023-02-11 DIAGNOSIS — Z01419 Encounter for gynecological examination (general) (routine) without abnormal findings: Secondary | ICD-10-CM | POA: Diagnosis not present

## 2023-02-11 DIAGNOSIS — Z124 Encounter for screening for malignant neoplasm of cervix: Secondary | ICD-10-CM | POA: Diagnosis not present

## 2023-04-18 ENCOUNTER — Other Ambulatory Visit: Payer: Self-pay | Admitting: Physician Assistant

## 2023-04-18 DIAGNOSIS — Z1231 Encounter for screening mammogram for malignant neoplasm of breast: Secondary | ICD-10-CM

## 2023-05-06 DIAGNOSIS — R109 Unspecified abdominal pain: Secondary | ICD-10-CM | POA: Diagnosis not present

## 2023-05-09 ENCOUNTER — Encounter: Payer: Self-pay | Admitting: Family Medicine

## 2023-05-09 ENCOUNTER — Other Ambulatory Visit: Payer: Self-pay | Admitting: Physician Assistant

## 2023-05-09 DIAGNOSIS — R109 Unspecified abdominal pain: Secondary | ICD-10-CM

## 2023-05-10 ENCOUNTER — Ambulatory Visit
Admission: RE | Admit: 2023-05-10 | Discharge: 2023-05-10 | Discharge status: Home / Self Care | Source: Ambulatory Visit | Attending: Physician Assistant | Admitting: Physician Assistant

## 2023-05-10 DIAGNOSIS — R109 Unspecified abdominal pain: Secondary | ICD-10-CM

## 2023-05-10 MED ORDER — IOPAMIDOL (ISOVUE-370) INJECTION 76%
80.0000 mL | Freq: Once | INTRAVENOUS | Status: AC | PRN
  Administered 2023-05-10: 80 mL via INTRAVENOUS

## 2023-06-01 ENCOUNTER — Ambulatory Visit
Admission: RE | Admit: 2023-06-01 | Discharge: 2023-06-01 | Disposition: A | Discharge status: Home / Self Care | Source: Ambulatory Visit | Attending: Physician Assistant | Admitting: Physician Assistant

## 2023-06-01 DIAGNOSIS — Z1231 Encounter for screening mammogram for malignant neoplasm of breast: Secondary | ICD-10-CM | POA: Diagnosis not present

## 2023-11-03 DIAGNOSIS — I10 Essential (primary) hypertension: Secondary | ICD-10-CM | POA: Diagnosis not present

## 2023-11-03 DIAGNOSIS — Z0000 Encounter for general adult medical examination without abnormal findings: Secondary | ICD-10-CM | POA: Diagnosis not present

## 2023-11-03 DIAGNOSIS — Z23 Encounter for immunization: Secondary | ICD-10-CM | POA: Diagnosis not present

## 2023-11-03 DIAGNOSIS — Z131 Encounter for screening for diabetes mellitus: Secondary | ICD-10-CM | POA: Diagnosis not present

## 2023-11-03 DIAGNOSIS — Z13220 Encounter for screening for lipoid disorders: Secondary | ICD-10-CM | POA: Diagnosis not present
